# Patient Record
Sex: Male | Born: 1945 | Race: White | Hispanic: No | Smoking: Never smoker
Health system: Southern US, Community
[De-identification: ages and names within clinical notes are randomized; demographics above are authoritative.]

## PROBLEM LIST (undated history)

## (undated) DIAGNOSIS — C61 Malignant neoplasm of prostate: Secondary | ICD-10-CM

## (undated) DIAGNOSIS — E7800 Pure hypercholesterolemia, unspecified: Secondary | ICD-10-CM

## (undated) DIAGNOSIS — I10 Essential (primary) hypertension: Secondary | ICD-10-CM

## (undated) HISTORY — PX: ABLATION OF DYSRHYTHMIC FOCUS: SHX254

## (undated) HISTORY — PX: OTHER SURGICAL HISTORY: SHX169

---

## 2021-01-12 ENCOUNTER — Encounter: Payer: Self-pay | Admitting: Medical Oncology

## 2021-01-26 ENCOUNTER — Telehealth: Payer: Self-pay | Admitting: Pharmacist

## 2021-01-26 ENCOUNTER — Other Ambulatory Visit: Payer: Self-pay

## 2021-01-26 ENCOUNTER — Other Ambulatory Visit (HOSPITAL_COMMUNITY): Payer: Self-pay

## 2021-01-26 ENCOUNTER — Encounter: Payer: Self-pay | Admitting: Medical Oncology

## 2021-01-26 ENCOUNTER — Inpatient Hospital Stay: Payer: Medicare Other | Attending: Oncology | Admitting: Oncology

## 2021-01-26 ENCOUNTER — Other Ambulatory Visit: Payer: Self-pay | Admitting: *Deleted

## 2021-01-26 VITALS — BP 155/69 | HR 52 | Temp 97.5°F | Resp 17 | Ht 69.0 in | Wt 221.3 lb

## 2021-01-26 DIAGNOSIS — Z7982 Long term (current) use of aspirin: Secondary | ICD-10-CM

## 2021-01-26 DIAGNOSIS — Z79899 Other long term (current) drug therapy: Secondary | ICD-10-CM

## 2021-01-26 DIAGNOSIS — I4891 Unspecified atrial fibrillation: Secondary | ICD-10-CM

## 2021-01-26 DIAGNOSIS — C61 Malignant neoplasm of prostate: Secondary | ICD-10-CM | POA: Diagnosis not present

## 2021-01-26 DIAGNOSIS — I10 Essential (primary) hypertension: Secondary | ICD-10-CM | POA: Diagnosis not present

## 2021-01-26 DIAGNOSIS — E291 Testicular hypofunction: Secondary | ICD-10-CM

## 2021-01-26 DIAGNOSIS — E785 Hyperlipidemia, unspecified: Secondary | ICD-10-CM

## 2021-01-26 DIAGNOSIS — C7951 Secondary malignant neoplasm of bone: Secondary | ICD-10-CM

## 2021-01-26 DIAGNOSIS — Z7901 Long term (current) use of anticoagulants: Secondary | ICD-10-CM

## 2021-01-26 MED ORDER — PREDNISONE 5 MG PO TABS
5.0000 mg | ORAL_TABLET | Freq: Every day | ORAL | 3 refills | Status: DC
  Filled 2021-01-26: qty 90, 90d supply, fill #0
  Filled 2021-05-10: qty 90, 90d supply, fill #1
  Filled 2021-05-31 – 2021-08-15 (×2): qty 90, 90d supply, fill #2
  Filled 2021-08-31 – 2021-11-10 (×2): qty 90, 90d supply, fill #3

## 2021-01-26 MED ORDER — ABIRATERONE ACETATE 250 MG PO TABS
1000.0000 mg | ORAL_TABLET | Freq: Every day | ORAL | 0 refills | Status: DC
  Filled 2021-01-26 – 2021-02-10 (×2): qty 120, 30d supply, fill #0

## 2021-01-26 NOTE — Progress Notes (Signed)
Reason for the request:    Prostate cancer  HPI: I was asked by Dr. Severiano Gilbert  to evaluate Raymond Huang for the evaluation of prostate cancer.  He is a 75 year old man with hypertension and hyperlipidemia and atrial fibrillation.  He is originally from [place] and relocated locally to live with his daughter.  He was found to have an elevated PSA and nodular prostate dating back to 2017.  He has been followed with urology regarding this issue and underwent a previous biopsy that did not show malignancy at that time.  He was found to have an elevated PSA up to 4.5 in March of 2022.  MRI of the pelvis obtained on December 15, 2020 was obtained which showed a left-sided mid sacral lesion measuring 2.3 cm and with the right inferior pubic ramus that showed a 1.5 cm bone lesion.  These are suspicious for metastatic disease.  No pelvic adenopathy noted.  He has subsequently underwent prostate biopsy which showed a Gleason score of 5+4 = 5 and 13 cores.  Based on these findings he was started on Eligard.  He received Eligard last injection on Jan 13, 2021 and also was started on abiraterone 1000 mg with prednisone.  Bone scan on December 04, 2020 showed focal activity in the posterior left mid sacral as well as right inferior pubic ramus.  He is referred to me to establish care.  He is scheduled to see Dr. Tammi Klippel next week for potential radiation treatment.  Clinically, he feels well without any major complaints.  He has reported some occasional hot flashes and some fatigue but otherwise tolerated treatment reasonably well.  His performance status and quality of life remained excellent.  He does not report any headaches, blurry vision, syncope or seizures. Does not report any fevers, chills or sweats.  Does not report any cough, wheezing or hemoptysis.  Does not report any chest pain, palpitation, orthopnea or leg edema.  Does not report any nausea, vomiting or abdominal pain.  Does not report any constipation or  diarrhea.  Does not report any skeletal complaints.    Does not report frequency, urgency or hematuria.  Does not report any skin rashes or lesions. Does not report any heat or cold intolerance.  Does not report any lymphadenopathy or petechiae.  Does not report any anxiety or depression.  Remaining review of systems is negative.    Past medical history significant for hypertension, hyperlipidemia and atrial fibrillation.   current medication clued aspirin, atenolol, lisinopril hydrochlorothiazide combination, lovastatin and Xarelto.    Social History   Socioeconomic History  . Marital status: Widowed    Spouse name: Not on file  . Number of children: Not on file  . Years of education: Not on file  . Highest education level: Not on file  Occupational History  . Not on file  Tobacco Use  . Smoking status: Not on file  . Smokeless tobacco: Not on file  Substance and Sexual Activity  . Alcohol use: Not on file  . Drug use: Not on file  . Sexual activity: Not on file  Other Topics Concern  . Not on file  Social History Narrative  . Not on file   Social Determinants of Health   Financial Resource Strain: Not on file  Food Insecurity: Not on file  Transportation Needs: Not on file  Physical Activity: Not on file  Stress: Not on file  Social Connections: Not on file  Intimate Partner Violence: Not on file  :  Pertinent items are noted in HPI.  Exam: Blood pressure (!) 155/69, pulse (!) 52, temperature (!) 97.5 F (36.4 C), temperature source Tympanic, resp. rate 17, height 5\' 9"  (1.753 m), weight 221 lb 4.8 oz (100.4 kg), SpO2 97 %.   ECOG 1 General appearance: alert and cooperative appeared without distress. Head: atraumatic without any abnormalities. Eyes: conjunctivae/corneas clear. PERRL.  Sclera anicteric. Throat: lips, mucosa, and tongue normal; without oral thrush or ulcers. Resp: clear to auscultation bilaterally without rhonchi, wheezes or dullness to  percussion. Cardio: regular rate and rhythm, S1, S2 normal, no murmur, click, rub or gallop GI: soft, non-tender; bowel sounds normal; no masses,  no organomegaly Skin: Skin color, texture, turgor normal. No rashes or lesions Lymph nodes: Cervical, supraclavicular, and axillary nodes normal. Neurologic: Grossly normal without any motor, sensory or deep tendon reflexes. Musculoskeletal: No joint deformity or effusion.    Assessment and Plan:   75 year old man with:  1.  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  His PSA peaked at 11.6 and found to have a Gleason score 4+5 = 9 and 13 cores.  MRI of the pelvis showed metastatic bone lesions in the left side of the mid sacrum as well as the right inferior pubic ramus.  No other areas of metastasis noted.  He was started on abiraterone and Eligard.  His disease status was updated today and treatment options were reviewed.  The natural course of castration-sensitive advanced prostate cancer with potentially oligometastatic disease with low volume involvement were discussed.  Long-term androgen deprivation therapy with abiraterone is certainly the standard of care at this time.  Additional therapy such as systemic chemotherapy utilizing Taxotere is deferred for future dates.  The role for local therapy with radiation and possibly ablative therapy to any oligometastatic lesions could also be considered.  I am in favor of updating his staging scans with PSMA PET to fully evaluate his metastatic disease prior to consideration for local therapy.  In the meantime, we will continue abiraterone 1000 mg daily with prednisone.   2.  Androgen depravation: He is currently on Eligard every 3 months and last injection given on Jan 13, 2021.  This will be repeated in 3 months.  3.  Bone directed therapy: I recommended calcium and vitamin D supplements with considerations for Xgeva after obtaining dental clearance.  4.  Hypertension:  This will be monitored on Zytiga.  Blood pressure today is slightly elevated Continue to monitor.   5.  Goals of care and prognosis: His disease is likely incurable although aggressive measures are warranted given his reasonable performance status.  6.  Follow-up: will be in the next few months to follow his progress.  60  minutes were dedicated to this visit. The time was spent on reviewing laboratory data, imaging studies, discussing treatment options, and answering questions regarding future plan.     A copy of this consult has been forwarded to the requesting physician.

## 2021-01-26 NOTE — Telephone Encounter (Signed)
Oral Oncology Pharmacist Encounter  Received new prescription for Zytiga (abiraterone) for the continued treatment of metastatic castration-sensitive prostate cancer in conjunction with ADT and prednisone, planned duration until disease progression or unacceptable drug toxicity.  Prescription dose and frequency assessed for appropriateness. Appropriate for therapy initiation. Note patient will be switching from previously taking abiraterone 250 mg by mouth once daily with low fat meal to now taking abiraterone 1000 mg by mouth once daily on an empty stomach 1 hour before or 2 hours after a meal  CMP and CBC w/ Diff from 01/06/21 assessed, labs stable, no dose adjustments required.  Current medication list in Epic reviewed, no relevant/significant DDIs with Zytiga identified.  Evaluated chart and no patient barriers to medication adherence noted.   Patient agreement for treatment documented in MD note on 01/26/21.  Prescription has been e-scribed to the [facility] for benefits analysis and approval.  Oral Oncology Clinic will continue to follow for insurance authorization, copayment issues, initial counseling and start date.  [name], PharmD, BCPS Hematology/Oncology Clinical Pharmacist [facility] [phone] 01/26/2021 3:12 PM

## 2021-01-27 ENCOUNTER — Other Ambulatory Visit (HOSPITAL_COMMUNITY): Payer: Self-pay

## 2021-01-27 NOTE — Progress Notes (Signed)
GU Location of Tumor / Histology: prostatic adenocarcinoma  If Prostate Cancer, Gleason Score is (5 + 4) and PSA is (4.5) Prostate volume:   Verner Chol. was found to have an elevated PSA and nodular prostate dating back to 2017.  Biopsies of prostate (if applicable) revealed:   Past/Anticipated interventions by urology, if any: prostate biopsy, Eligard, bone scan (focal activity in the posterior left mid sacral as well as right inferior pubic ramus), referral to Dr. Tammi Klippel to discuss radiation options.  Past/Anticipated interventions by medical oncology, if any: no  Weight changes, if any: none  Bowel/Bladder complaints, if any: none   Nausea/Vomiting, if any: none  Pain issues, if any:  none  SAFETY ISSUES:  Prior radiation? none  Pacemaker/ICD? none  Possible current pregnancy? no  Is the patient on methotrexate? no  Current Complaints / other details:  75 year old male. Widowed. Doing well has some questions about dosage of Zytiga that I will call and check on for him. No other issues at present.

## 2021-01-28 ENCOUNTER — Telehealth: Payer: Self-pay

## 2021-01-28 NOTE — Telephone Encounter (Signed)
Oral Oncology Patient Advocate Encounter  After completing a benefits investigation, prior authorization for Zytiga is not required at this time through [employer].  Patient needs to call customer service to see if there is a contracted pharmacy in the area since he is moving from [place]. [facility] is not contracted to fill.  Patient's current plan will terminate on 02/10/21 as he should find a new Medicare plan after moving to [place].    [place] [facility] [place] Phone [phone] Fax [phone] 01/28/2021 9:17 AM

## 2021-01-29 ENCOUNTER — Encounter: Payer: Self-pay | Admitting: Medical Oncology

## 2021-01-29 ENCOUNTER — Other Ambulatory Visit: Payer: Self-pay

## 2021-01-29 ENCOUNTER — Ambulatory Visit
Admission: RE | Admit: 2021-01-29 | Discharge: 2021-01-29 | Disposition: A | Discharge status: Home / Self Care | Payer: Medicare Other | Source: Ambulatory Visit | Attending: Radiation Oncology | Admitting: Radiation Oncology

## 2021-01-29 ENCOUNTER — Encounter: Payer: Self-pay | Admitting: Radiation Oncology

## 2021-01-29 VITALS — BP 134/73 | HR 53 | Temp 97.8°F | Resp 20 | Ht 69.5 in | Wt 217.8 lb

## 2021-01-29 DIAGNOSIS — C61 Malignant neoplasm of prostate: Secondary | ICD-10-CM

## 2021-01-29 HISTORY — DX: Pure hypercholesterolemia, unspecified: E78.00

## 2021-01-29 HISTORY — DX: Essential (primary) hypertension: I10

## 2021-01-29 HISTORY — DX: Malignant neoplasm of prostate: C61

## 2021-01-29 NOTE — Progress Notes (Signed)
Spoke with patient to inform him Dr. Alen Blew, would like for him to take 1000 mg of Zytiga on an empty stomach. I asked him about his new insurance coverage and he has a plan that is effective June 1. I did speak with Lovena Le in nuclear med and she is waiting to see if PET needs authorization. She will contact him on Monday with an appointment. He asked about financial help with Zytiga cost. I explained that the oral chemo pharmacist/tteam, will assist him with this. He voiced understanding of he above.

## 2021-01-29 NOTE — Progress Notes (Signed)
I met Mr. Anthem 5/17, when he consulted with Dr.Shadad. Today he is meeting with Dr. Tammi Klippel to discuss radiation. He has questions about his Zytiga dose. He is currently taking a  250 mg capsule in the morning with prednisone. Dr. Alen Blew has prescribed 1000 mg on an empty stomach. He states his labs were good and Dr. Alen Blew was pleased, so he would like for me to confirm the dose. Dr. Alen Blew ordered a PSMA PET which has not been scheduled. I will follow up on these issues and call him. He voiced understanding.

## 2021-01-29 NOTE — Progress Notes (Signed)
Introduced myself to patient as the prostate nurse navigator and discussed my role. He has recently relocated from [place], [place] to [place] to be closer to his daughter. He was diagnosed in March with prostate cancer. He started ADT,  abiraterone and prednisone on  12/16/2020. He has a consult with Dr. Tammi Klippel later this week to discuss radiation. I will follow up with him 5/20. No barriers to care at this time. Asked him to call me with questions or concerns.

## 2021-01-29 NOTE — Progress Notes (Signed)
Radiation Oncology         [phone]) [phone] ________________________________  Initial outpatient Consultation  Name: Raymond Huang. MRN: [id number]  Date: 01/29/2021  DOB: 1946/03/27  CC:No primary care provider on file.  Ardeen Jourdain, MD   REFERRING PHYSICIAN: Ardeen Jourdain, MD  DIAGNOSIS: 75 y.o. 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    ICD-10-CM   1. Prostate cancer [facility])  C61     HISTORY OF PRESENT ILLNESS: Raymond Huang. is a 75 y.o. male with a diagnosis of prostate cancer. He has a history of elevated PSA since at least 2017 when he had an initial prostate biopsy that was benign.  His PSA has fluctuated since that time and was recently noted to be elevated at 4.54 by his primary care physician in [place], [place]. Prior PSA in 11/2019 was 2.05 and in 06/2019 was 1.7. Accordingly, he underwent prostate MRI on 09/21/20 showing a PI-RADS 5 lesion in the left peripheral zone in the mid gland towards the apex with probable EPE but no lymphadenopathy or seminal vesicle involvement.the prostate volume was estimated to be 37.3 grams. He was referred for evaluation in urology by Dr. Dema Severin and proceeded to transrectal ultrasound with 12 biopsies of the prostate on 11/24/20. Out of 13 core biopsies, all 13 were positive.  The maximum Gleason score was 5+4, and this was seen in 11/13 cores. Additionally, Gleason 4+5 was seen in the remaining two cores.   For disease staging, a bone scan was performed on 12/04/20 showing focal activity in the posterior left mid sacrum and right inferior pubic ramus, suspicious for metastatic disease and milder activity in a posterior lateral right 9th rib, possibly posttraumatic although metastatic disease is not entirely excluded. This prompted an MRI of the pelvis which was performed on 12/15/20 confirming a lesion within the left side of  the mid sacrum and the right inferior pubic ramus, corresponding to the bone scan findings and consistent with metastatic disease.  There was a 7 mm left pelvic sidewall lymph node but no pathologic lymphadenopathy noted.  A repeat PSA on 12/16/20 showed an increase to 11.62, therefore he was started on Eligard and abiraterone. His most recent PSA from 01/13/21 showed a good response to the ADT with a decrease to 0.13 and he received a 12-monthEligard injection at that time.  [facility] [place] [facility] [place] [place] [facility] [facility]won 01/26/21. The recommendation was to proceed with PSMA scan to confirm limited metastatic disease to help determine the most appropriate course of treatment. This study has not yet been scheduled.  The patient reviewed the biopsy results with his urologist and he has kindly been referred today for discussion of potential radiation treatment options.   PREVIOUS RADIATION THERAPY: No  PAST MEDICAL HISTORY:  Past Medical History:  Diagnosis Date  . High cholesterol   . Hypertension   . Prostate cancer ([facility]       PAST SURGICAL HISTORY: Past Surgical History:  Procedure Laterality Date  . ABLATION OF DYSRHYTHMIC FOCUS    . torn meniscus Right   . torn meniscus Left     FAMILY HISTORY: No family history on file.  SOCIAL HISTORY:  Social History   Socioeconomic History  . Marital status: Widowed    Spouse name: Not on file  . Number of children: Not on file  . Years of education:  Not on file  . Highest education level: Not on file  Occupational History  . Not on file  Tobacco Use  . Smoking status: Not on file  . Smokeless tobacco: Not on file  Substance and Sexual Activity  . Alcohol use: Not on file  . Drug use: Not on file  . Sexual activity: Not on file  Other Topics Concern  . Not on file  Social History Narrative  . Not on file   Social Determinants of Health   Financial Resource Strain: Not on file  Food Insecurity: Not on file   Transportation Needs: Not on file  Physical Activity: Not on file  Stress: Not on file  Social Connections: Not on file  Intimate Partner Violence: Not on file    ALLERGIES: Patient has no known allergies.  MEDICATIONS:  Current Outpatient Medications  Medication Sig Dispense Refill  . abiraterone acetate (ZYTIGA) 250 MG tablet Take 4 tablets (1,000 mg total) by mouth daily. Take on an empty stomach 1 hour before or 2 hours after a meal 120 tablet 0  . ammonium lactate (LAC-HYDRIN) 12 % lotion Apply topically.    Marland Kitchen aspirin 81 MG EC tablet Take by mouth.    Marland Kitchen atenolol (TENORMIN) 50 MG tablet Take 50 mg by mouth 2 (two) times daily.    . Coenzyme Q10 200 MG capsule Take by mouth.    Marland Kitchen ketoconazole (NIZORAL) 2 % shampoo Apply topically.    Marland Kitchen lisinopril-hydrochlorothiazide (ZESTORETIC) 20-12.5 MG tablet Take 1 tablet by mouth daily.    . mometasone (ELOCON) 0.1 % lotion Apply 0.1 application topically 1 day or 1 dose.    . Multiple Vitamin (MULTIVITAMINS PO) Take by mouth.    . pravastatin (PRAVACHOL) 40 MG tablet Take 40 mg by mouth daily.    . predniSONE (DELTASONE) 5 MG tablet Take 1 tablet (5 mg total) by mouth daily with breakfast. 90 tablet 3  . rivaroxaban (XARELTO) 20 MG TABS tablet TAKE 1 TABLET DAILY WITH DINNER     No current facility-administered medications for this encounter.    REVIEW OF SYSTEMS:  On review of systems, the patient reports that he is doing well overall. He denies any chest pain, shortness of breath, cough, fevers, chills, night sweats, unintended weight changes. He denies any bowel disturbances, and denies abdominal pain, nausea or vomiting. He denies any new musculoskeletal or joint aches or pains. His IPSS was 1, indicating mild urinary symptoms with nocturia x1 per night only. A complete review of systems is obtained and is otherwise negative.    PHYSICAL EXAM:  Wt Readings from Last 3 Encounters:  01/29/21 217 lb 12.8 oz (98.8 kg)  01/26/21 221 lb  4.8 oz (100.4 kg)   Temp Readings from Last 3 Encounters:  01/29/21 97.8 F (36.6 C)  01/26/21 (!) 97.5 F (36.4 C) (Tympanic)   BP Readings from Last 3 Encounters:  01/29/21 134/73  01/26/21 (!) 155/69   Pulse Readings from Last 3 Encounters:  01/29/21 (!) 53  01/26/21 (!) 52   Pain Assessment Pain Score: 0-No pain/10  In general this is a well appearing Caucasian male in no acute distress. He's alert and oriented x4 and appropriate throughout the examination. Cardiopulmonary assessment is negative for acute distress, and he exhibits normal effort.     KPS = 100  100 - Normal; no complaints; no evidence of disease. 90   - Able to carry on normal activity; minor signs or symptoms of disease. 80   -  Normal activity with effort; some signs or symptoms of disease. 34   - Cares for self; unable to carry on normal activity or to do active work. 60   - Requires occasional assistance, but is able to care for most of his personal needs. 50   - Requires considerable assistance and frequent medical care. 63   - Disabled; requires special care and assistance. 52   - Severely disabled; hospital admission is indicated although death not imminent. 61   - Very sick; hospital admission necessary; active supportive treatment necessary. 10   - Moribund; fatal processes progressing rapidly. 0     - Dead  Karnofsky DA, Abelmann WH, Craver LS and Burchenal JH [phone]) The use of the nitrogen mustards in the palliative treatment of carcinoma: with particular reference to bronchogenic carcinoma Cancer 1 634-56  LABORATORY DATA:  No results found for: WBC, HGB, HCT, MCV, PLT No results found for: NA, K, CL, CO2 No results found for: ALT, AST, GGT, ALKPHOS, BILITOT   RADIOGRAPHY: No results found.    IMPRESSION/PLAN: 1. 75 y.o. gentleman with Stage IV (cT2b, N0, M1b) adenocarcinoma of the prostate with Gleason score of 5+4, and PSA of 11.62. We discussed the patient's workup and outlined the  nature of prostate cancer in this setting. The patient's T stage, Gleason's score, and PSA put him into the very high risk group and recent MRI pelvis confirmed oligometastatic disease in the bony pelvis.  The recommendation is to proceed with a PSMA scan to confirm the extent of metastatic disease to help better inform our treatment recommendations.  Pending this study confirms limited metastatic disease only, he is eligible for a variety of potential treatment options including definitive therapy with LT-ADT and abiraterone concurrent with 4 weeks of external radiation (STAMPEDE study) to the prostate, pelvic nodes and sites of oligometastases versus a more palliative approach with LT-ADT and abiraterone alone. We discussed the available radiation techniques, and focused on the details and logistics of delivery. We discussed and outlined the risks, benefits, short and long-term effects associated with radiotherapy. We also detailed the role of ADT in the treatment of high risk prostate cancer and outlined the associated side effects that could be expected with this therapy which he was already well informed.  He appears to have a good understanding of his disease and our treatment recommendations which are of curative intent pending his upcoming PSMA scan confirms oligometastatic disease.  He was encouraged to ask questions that were answered to his stated satisfaction.  At the conclusion of our conversation, the patient is interested in moving forward with a PSMA scan and pending this study confirms limited metastatic disease only, he would like to proceed with definitive therapy with LT-ADT and abiraterone concurrent with 4 weeks of external radiation (STAMPEDE study) to the prostate, pelvic nodes and sites of oligometastases.  We will share our discussion with Dr. Alen Blew and once we have the results from his PSMA scan, we will proceed with treatment planning accordingly, in anticipation of beginning his daily  treatments in June 2022, approximately 2 months after starting ADT. We enjoyed meeting him today and look forward to continuing to participate in his care.  He knows that he is welcome to call anytime with any questions or concerns in the interim.  We personally spent 80 minutes in this encounter including chart review, reviewing radiological studies, meeting face-to-face with the patient, entering orders and completing documentation.   Raymond Johns, PA-C    Tyler Pita,  MD  [facility] Direct Dial: [phone]  Fax: [phone] [email or web address]  Skype  [email or web address]   This document serves as a record of services personally performed by Tyler Pita, MD and Freeman Caldron, PA-C. It was created on their behalf by Wilburn Mylar, a trained medical scribe. The creation of this record is based on the scribe's personal observations and the provider's statements to them. This document has been checked and approved by the attending provider.

## 2021-02-01 ENCOUNTER — Encounter: Payer: Self-pay | Admitting: Oncology

## 2021-02-01 ENCOUNTER — Other Ambulatory Visit (HOSPITAL_COMMUNITY): Payer: Self-pay

## 2021-02-01 NOTE — Telephone Encounter (Signed)
Oral Chemotherapy Pharmacist Encounter   Spoke with patient today to follow up regarding patient's oral chemotherapy medication: Zytiga (abiraterone acetate)  Discussed in detail with patient that he is now taking Zytiga 250 mg tablets, 4 tablets (1000 mg total) by mouth once daily on an empty stomach either 1 hour before or 2 hours after a meal. Patient verbalized understanding and started taking 4 tablets once daily on 02/01/21. Patient confirmed he continues to take prednisone 5 mg tablet by mouth once daily with breakfast. Patient states that he has enough medication on hand from his previous pharmacy to get him through June 1st.  Reviewed with patient administration, dosing, side effects, monitoring, drug-food interactions, safe handling, storage, and disposal. Patient expressed no concerns about side effects with Zytiga and states he has been tolerating the medication very well.   Patient's new insurance information has been put on file at the outpatient pharmacy, which will be effective June 1st. Raymond Huang has obtained the patient's prostate cancer grant information that was being utilized at Raymond Huang's previous specialty pharmacy in [place].   Patient knows to call the office with questions or concerns. Oral Chemotherapy Clinic phone number provided to patient.   [name], PharmD, BCPS Hematology/Oncology Clinical Pharmacist [facility] [phone] 02/01/2021 2:11 PM

## 2021-02-04 ENCOUNTER — Encounter: Payer: Self-pay | Admitting: Medical Oncology

## 2021-02-09 ENCOUNTER — Encounter: Payer: Self-pay | Admitting: Medical Oncology

## 2021-02-10 ENCOUNTER — Encounter: Payer: Self-pay | Admitting: Medical Oncology

## 2021-02-10 ENCOUNTER — Other Ambulatory Visit (HOSPITAL_COMMUNITY): Payer: Self-pay

## 2021-02-10 ENCOUNTER — Encounter: Payer: Self-pay | Admitting: Oncology

## 2021-02-10 ENCOUNTER — Other Ambulatory Visit: Payer: Self-pay

## 2021-02-10 ENCOUNTER — Ambulatory Visit: Admission: RE | Admit: 2021-02-10 | Payer: Medicare Other | Source: Ambulatory Visit | Admitting: Radiation Oncology

## 2021-02-10 ENCOUNTER — Other Ambulatory Visit: Payer: Self-pay | Admitting: Oncology

## 2021-02-10 MED ORDER — ABIRATERONE ACETATE 250 MG PO TABS
1000.0000 mg | ORAL_TABLET | Freq: Every day | ORAL | 0 refills | Status: DC
  Filled 2021-02-11: qty 120, 30d supply, fill #0

## 2021-02-11 ENCOUNTER — Other Ambulatory Visit (HOSPITAL_COMMUNITY): Payer: Self-pay

## 2021-02-12 ENCOUNTER — Encounter: Payer: Self-pay | Admitting: Medical Oncology

## 2021-02-12 ENCOUNTER — Encounter: Payer: Self-pay | Admitting: Oncology

## 2021-02-12 ENCOUNTER — Other Ambulatory Visit (HOSPITAL_COMMUNITY): Payer: Self-pay

## 2021-02-12 NOTE — Progress Notes (Signed)
Spoke with patient to see if he has been contacted with an appointment for PSMA scan. He voiced his concern that he was scheduled for CT simulation and after he arrived and was call back, he was told he did not have an appointment. He was scheduled for radiation treatments and now all the appointments are gone. I explained Dr. Tammi Klippel would like to get the PET results before treatment, to confirm if there are or are not any new cancer mets. I explained the ADT and the Zytiga are treating his cancer. I will follow up with nuclear med to see why PET has not been scheduled. He voiced understanding.

## 2021-02-12 NOTE — Progress Notes (Signed)
Spoke with patient to confirm, he received a call from [place] in nuclear med to schedule PSMA PET. He states he did and thankful for appointment. We discussed once the PET is resulted, Dr. Tammi Klippel will know how to proceed with treatment. He voiced his appreciation.  I asked him to call me with questions or concerns.

## 2021-02-12 NOTE — Progress Notes (Signed)
Left message with Lovena Le in nuclear med to follow up on PSMA scan scheduling.

## 2021-02-12 NOTE — Progress Notes (Signed)
Raymond Huang from nuclear med left a message asking about patinet's new insurance information. I left her a message that new insurance information is under media.

## 2021-02-16 ENCOUNTER — Encounter: Payer: Self-pay | Admitting: Oncology

## 2021-02-22 ENCOUNTER — Encounter: Payer: Self-pay | Admitting: Oncology

## 2021-02-22 ENCOUNTER — Ambulatory Visit: Payer: Medicare Other

## 2021-02-23 ENCOUNTER — Other Ambulatory Visit: Payer: Self-pay

## 2021-02-23 ENCOUNTER — Inpatient Hospital Stay: Payer: Medicare Other | Attending: Oncology

## 2021-02-23 ENCOUNTER — Encounter: Payer: Self-pay | Admitting: Oncology

## 2021-02-23 ENCOUNTER — Ambulatory Visit: Payer: Medicare Other

## 2021-02-23 DIAGNOSIS — C61 Malignant neoplasm of prostate: Secondary | ICD-10-CM | POA: Diagnosis present

## 2021-02-23 LAB — CBC WITH DIFFERENTIAL (CANCER CENTER ONLY)
Abs Immature Granulocytes: 0.01 10*3/uL (ref 0.00–0.07)
Basophils Absolute: 0.1 10*3/uL (ref 0.0–0.1)
Basophils Relative: 1 %
Eosinophils Absolute: 0.2 10*3/uL (ref 0.0–0.5)
Eosinophils Relative: 2 %
HCT: 39 % (ref 39.0–52.0)
Hemoglobin: 13.8 g/dL (ref 13.0–17.0)
Immature Granulocytes: 0 %
Lymphocytes Relative: 14 %
Lymphs Abs: 1.1 10*3/uL (ref 0.7–4.0)
MCH: 31.1 pg (ref 26.0–34.0)
MCHC: 35.4 g/dL (ref 30.0–36.0)
MCV: 87.8 fL (ref 80.0–100.0)
Monocytes Absolute: 0.4 10*3/uL (ref 0.1–1.0)
Monocytes Relative: 5 %
Neutro Abs: 6 10*3/uL (ref 1.7–7.7)
Neutrophils Relative %: 78 %
Platelet Count: 239 10*3/uL (ref 150–400)
RBC: 4.44 MIL/uL (ref 4.22–5.81)
RDW: 12.2 % (ref 11.5–15.5)
WBC Count: 7.7 10*3/uL (ref 4.0–10.5)
nRBC: 0 % (ref 0.0–0.2)

## 2021-02-23 LAB — CMP (CANCER CENTER ONLY)
ALT: 20 U/L (ref 0–44)
AST: 21 U/L (ref 15–41)
Albumin: 4.1 g/dL (ref 3.5–5.0)
Alkaline Phosphatase: 48 U/L (ref 38–126)
Anion gap: 11 (ref 5–15)
BUN: 20 mg/dL (ref 8–23)
CO2: 24 mmol/L (ref 22–32)
Calcium: 10 mg/dL (ref 8.9–10.3)
Chloride: 103 mmol/L (ref 98–111)
Creatinine: 1.18 mg/dL (ref 0.61–1.24)
GFR, Estimated: >60 mL/min (ref >60)
Glucose, Bld: 135 mg/dL — ABNORMAL HIGH (ref 70–99)
Potassium: 4.1 mmol/L (ref 3.5–5.1)
Sodium: 138 mmol/L (ref 135–145)
Total Bilirubin: 1.1 mg/dL (ref 0.3–1.2)
Total Protein: 6.9 g/dL (ref 6.5–8.1)

## 2021-02-23 NOTE — Progress Notes (Signed)
Met with patient at registration to introduce myself as Financial Resource Specialist and to offer available resources.  Discussed one-time $1000 [employer] grant and qualifications to assist with personal expenses while going through treatment.  Gave him my card if interested in applying and for any additional financial questions or concerns.  

## 2021-02-24 ENCOUNTER — Ambulatory Visit: Payer: Medicare Other

## 2021-02-24 ENCOUNTER — Ambulatory Visit (HOSPITAL_COMMUNITY)
Admission: RE | Admit: 2021-02-24 | Discharge: 2021-02-24 | Disposition: A | Discharge status: Home / Self Care | Payer: Medicare Other | Source: Ambulatory Visit | Attending: Oncology | Admitting: Oncology

## 2021-02-24 ENCOUNTER — Telehealth: Payer: Self-pay | Admitting: *Deleted

## 2021-02-24 DIAGNOSIS — C61 Malignant neoplasm of prostate: Secondary | ICD-10-CM | POA: Diagnosis present

## 2021-02-24 LAB — PROSTATE-SPECIFIC AG, SERUM (LABCORP): Prostate Specific Ag, Serum: <0.1 ng/mL (ref 0.0–4.0)

## 2021-02-24 MED ORDER — PIFLIFOLASTAT F 18 (PYLARIFY) INJECTION
9.0000 | Freq: Once | INTRAVENOUS | Status: AC
  Administered 2021-02-24: 9.9 via INTRAVENOUS

## 2021-02-24 NOTE — Telephone Encounter (Signed)
Contacted patient regarding test results per Dr.Shadad - patient verbalized understanding of information

## 2021-02-24 NOTE — Telephone Encounter (Signed)
-----   Message from Wyatt Portela, MD sent at 02/24/2021  8:27 AM EDT ----- Please let him know his PSA is still low

## 2021-02-25 ENCOUNTER — Ambulatory Visit: Payer: Medicare Other

## 2021-02-26 ENCOUNTER — Ambulatory Visit
Admission: RE | Admit: 2021-02-26 | Discharge: 2021-02-26 | Disposition: A | Discharge status: Home / Self Care | Payer: Medicare Other | Source: Ambulatory Visit | Attending: Radiation Oncology | Admitting: Radiation Oncology

## 2021-02-26 ENCOUNTER — Ambulatory Visit: Payer: Medicare Other

## 2021-02-26 ENCOUNTER — Other Ambulatory Visit: Payer: Self-pay

## 2021-02-26 DIAGNOSIS — C61 Malignant neoplasm of prostate: Secondary | ICD-10-CM | POA: Diagnosis present

## 2021-02-26 DIAGNOSIS — C7931 Secondary malignant neoplasm of brain: Secondary | ICD-10-CM | POA: Diagnosis not present

## 2021-02-26 DIAGNOSIS — Z510 Encounter for antineoplastic radiation therapy: Secondary | ICD-10-CM | POA: Insufficient documentation

## 2021-02-26 NOTE — Progress Notes (Signed)
  Radiation Oncology         [phone]) [phone] ________________________________  Name: Raymond Huang. MRN: [id number]  Date: 02/26/2021  DOB: 08-23-1946  SIMULATION AND TREATMENT PLANNING NOTE    ICD-10-CM   1. Prostate cancer ([place])  C61       DIAGNOSIS:  75 y.o. 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  NARRATIVE:  The patient was brought to the [facility].  Identity was confirmed.  All relevant records and images related to the planned course of therapy were reviewed.  The patient freely provided informed written consent to proceed with treatment after reviewing the details related to the planned course of therapy. The consent form was witnessed and verified by the simulation staff.  Then, the patient was set-up in a stable reproducible supine position for radiation therapy.  A vacuum lock pillow device was custom fabricated to position his legs in a reproducible immobilized position.  Then, I performed a urethrogram under sterile conditions to identify the prostatic apex.  CT images were obtained.  Surface markings were placed.  The CT images were loaded into the planning software.  Then the prostate target and avoidance structures including the rectum, bladder, bowel and hips were contoured.  Treatment planning then occurred.  The radiation prescription was entered and confirmed.  A total of one complex treatment devices were fabricated. I have requested : Intensity Modulated Radiotherapy (IMRT) is medically necessary for this case for the following reason:  Rectal sparing.Marland Kitchen  PLAN:  The patient will receive 44 Gy in 20 fractions of 2.2 Gy, using a simultaneous integrated boost (SIB) to the prostate and pelvic bone oligometastasis to a total dose of 60 Gy with 20 fractions of 3.0 Gy.  ________________________________  Sheral Apley Tammi Klippel, M.D.

## 2021-03-01 ENCOUNTER — Ambulatory Visit: Payer: Medicare Other

## 2021-03-01 DIAGNOSIS — C61 Malignant neoplasm of prostate: Secondary | ICD-10-CM | POA: Diagnosis not present

## 2021-03-02 ENCOUNTER — Ambulatory Visit: Payer: Medicare Other

## 2021-03-03 ENCOUNTER — Ambulatory Visit: Payer: Medicare Other

## 2021-03-04 ENCOUNTER — Other Ambulatory Visit: Payer: Self-pay | Admitting: Oncology

## 2021-03-04 ENCOUNTER — Other Ambulatory Visit (HOSPITAL_COMMUNITY): Payer: Self-pay

## 2021-03-04 ENCOUNTER — Ambulatory Visit: Payer: Medicare Other

## 2021-03-04 MED ORDER — ABIRATERONE ACETATE 250 MG PO TABS
1000.0000 mg | ORAL_TABLET | Freq: Every day | ORAL | 0 refills | Status: DC
  Filled 2021-03-04: qty 120, 30d supply, fill #0

## 2021-03-05 ENCOUNTER — Ambulatory Visit: Payer: Medicare Other

## 2021-03-08 ENCOUNTER — Ambulatory Visit: Payer: Medicare Other

## 2021-03-09 ENCOUNTER — Ambulatory Visit: Payer: Medicare Other

## 2021-03-09 ENCOUNTER — Other Ambulatory Visit (HOSPITAL_COMMUNITY): Payer: Self-pay

## 2021-03-10 ENCOUNTER — Ambulatory Visit: Payer: Medicare Other

## 2021-03-10 ENCOUNTER — Other Ambulatory Visit (HOSPITAL_COMMUNITY): Payer: Self-pay

## 2021-03-10 ENCOUNTER — Other Ambulatory Visit: Payer: Self-pay

## 2021-03-10 ENCOUNTER — Ambulatory Visit
Admission: RE | Admit: 2021-03-10 | Discharge: 2021-03-10 | Disposition: A | Discharge status: Home / Self Care | Payer: Medicare Other | Source: Ambulatory Visit | Attending: Radiation Oncology | Admitting: Radiation Oncology

## 2021-03-10 DIAGNOSIS — Z510 Encounter for antineoplastic radiation therapy: Secondary | ICD-10-CM | POA: Diagnosis not present

## 2021-03-11 ENCOUNTER — Ambulatory Visit: Payer: Medicare Other

## 2021-03-11 ENCOUNTER — Ambulatory Visit
Admission: RE | Admit: 2021-03-11 | Discharge: 2021-03-11 | Disposition: A | Discharge status: Home / Self Care | Payer: Medicare Other | Source: Ambulatory Visit | Attending: Radiation Oncology | Admitting: Radiation Oncology

## 2021-03-11 DIAGNOSIS — Z510 Encounter for antineoplastic radiation therapy: Secondary | ICD-10-CM | POA: Diagnosis not present

## 2021-03-12 ENCOUNTER — Ambulatory Visit
Admission: RE | Admit: 2021-03-12 | Discharge: 2021-03-12 | Disposition: A | Discharge status: Home / Self Care | Payer: Medicare Other | Source: Ambulatory Visit | Attending: Radiation Oncology | Admitting: Radiation Oncology

## 2021-03-12 ENCOUNTER — Ambulatory Visit: Payer: Medicare Other

## 2021-03-12 ENCOUNTER — Other Ambulatory Visit: Payer: Self-pay

## 2021-03-12 DIAGNOSIS — C7931 Secondary malignant neoplasm of brain: Secondary | ICD-10-CM | POA: Diagnosis not present

## 2021-03-12 DIAGNOSIS — C61 Malignant neoplasm of prostate: Secondary | ICD-10-CM | POA: Diagnosis present

## 2021-03-12 DIAGNOSIS — Z510 Encounter for antineoplastic radiation therapy: Secondary | ICD-10-CM | POA: Insufficient documentation

## 2021-03-12 NOTE — Progress Notes (Signed)
Pt here for patient teaching.    Pt given Radiation and You booklet and skin care instructions.    Reviewed areas of pertinence such as diarrhea, fatigue, hair loss, nausea and vomiting, sexual and fertility changes, skin changes, and urinary and bladder changes .   Pt able to give teach back of to pat skin, use unscented/gentle soap, use baby wipes, have Imodium on hand, and drink plenty of water,avoid applying anything to skin within 4 hours of treatment.   Pt verbalizes understanding of information given and will contact nursing with any questions or concerns.    [email or web address]

## 2021-03-16 ENCOUNTER — Other Ambulatory Visit: Payer: Self-pay

## 2021-03-16 ENCOUNTER — Ambulatory Visit: Payer: Medicare Other

## 2021-03-16 ENCOUNTER — Ambulatory Visit
Admission: RE | Admit: 2021-03-16 | Discharge: 2021-03-16 | Disposition: A | Discharge status: Home / Self Care | Payer: Medicare Other | Source: Ambulatory Visit | Attending: Radiation Oncology | Admitting: Radiation Oncology

## 2021-03-16 DIAGNOSIS — Z510 Encounter for antineoplastic radiation therapy: Secondary | ICD-10-CM | POA: Diagnosis not present

## 2021-03-17 ENCOUNTER — Ambulatory Visit: Payer: Medicare Other

## 2021-03-17 ENCOUNTER — Ambulatory Visit
Admission: RE | Admit: 2021-03-17 | Discharge: 2021-03-17 | Disposition: A | Discharge status: Home / Self Care | Payer: Medicare Other | Source: Ambulatory Visit | Attending: Radiation Oncology | Admitting: Radiation Oncology

## 2021-03-17 DIAGNOSIS — Z510 Encounter for antineoplastic radiation therapy: Secondary | ICD-10-CM | POA: Diagnosis not present

## 2021-03-18 ENCOUNTER — Ambulatory Visit
Admission: RE | Admit: 2021-03-18 | Discharge: 2021-03-18 | Disposition: A | Discharge status: Home / Self Care | Payer: Medicare Other | Source: Ambulatory Visit | Attending: Radiation Oncology | Admitting: Radiation Oncology

## 2021-03-18 ENCOUNTER — Ambulatory Visit: Payer: Medicare Other

## 2021-03-18 ENCOUNTER — Other Ambulatory Visit: Payer: Self-pay

## 2021-03-18 DIAGNOSIS — Z510 Encounter for antineoplastic radiation therapy: Secondary | ICD-10-CM | POA: Diagnosis not present

## 2021-03-19 ENCOUNTER — Ambulatory Visit: Payer: Medicare Other

## 2021-03-19 ENCOUNTER — Ambulatory Visit
Admission: RE | Admit: 2021-03-19 | Discharge: 2021-03-19 | Disposition: A | Discharge status: Home / Self Care | Payer: Medicare Other | Source: Ambulatory Visit | Attending: Radiation Oncology | Admitting: Radiation Oncology

## 2021-03-19 DIAGNOSIS — Z510 Encounter for antineoplastic radiation therapy: Secondary | ICD-10-CM | POA: Diagnosis not present

## 2021-03-22 ENCOUNTER — Other Ambulatory Visit: Payer: Self-pay

## 2021-03-22 ENCOUNTER — Ambulatory Visit: Payer: Medicare Other

## 2021-03-22 ENCOUNTER — Ambulatory Visit
Admission: RE | Admit: 2021-03-22 | Discharge: 2021-03-22 | Disposition: A | Discharge status: Home / Self Care | Payer: Medicare Other | Source: Ambulatory Visit | Attending: Radiation Oncology | Admitting: Radiation Oncology

## 2021-03-22 DIAGNOSIS — Z510 Encounter for antineoplastic radiation therapy: Secondary | ICD-10-CM | POA: Diagnosis not present

## 2021-03-23 ENCOUNTER — Ambulatory Visit
Admission: RE | Admit: 2021-03-23 | Discharge: 2021-03-23 | Disposition: A | Discharge status: Home / Self Care | Payer: Medicare Other | Source: Ambulatory Visit | Attending: Radiation Oncology | Admitting: Radiation Oncology

## 2021-03-23 ENCOUNTER — Ambulatory Visit: Payer: Medicare Other

## 2021-03-23 DIAGNOSIS — Z510 Encounter for antineoplastic radiation therapy: Secondary | ICD-10-CM | POA: Diagnosis not present

## 2021-03-24 ENCOUNTER — Other Ambulatory Visit: Payer: Self-pay

## 2021-03-24 ENCOUNTER — Ambulatory Visit: Payer: Medicare Other

## 2021-03-24 ENCOUNTER — Ambulatory Visit
Admission: RE | Admit: 2021-03-24 | Discharge: 2021-03-24 | Disposition: A | Discharge status: Home / Self Care | Payer: Medicare Other | Source: Ambulatory Visit | Attending: Radiation Oncology | Admitting: Radiation Oncology

## 2021-03-24 DIAGNOSIS — Z510 Encounter for antineoplastic radiation therapy: Secondary | ICD-10-CM | POA: Diagnosis not present

## 2021-03-25 ENCOUNTER — Ambulatory Visit
Admission: RE | Admit: 2021-03-25 | Discharge: 2021-03-25 | Disposition: A | Discharge status: Home / Self Care | Payer: Medicare Other | Source: Ambulatory Visit | Attending: Radiation Oncology | Admitting: Radiation Oncology

## 2021-03-25 ENCOUNTER — Ambulatory Visit: Payer: Medicare Other

## 2021-03-25 DIAGNOSIS — Z510 Encounter for antineoplastic radiation therapy: Secondary | ICD-10-CM | POA: Diagnosis not present

## 2021-03-26 ENCOUNTER — Ambulatory Visit: Payer: Medicare Other

## 2021-03-26 ENCOUNTER — Encounter: Payer: Self-pay | Admitting: Oncology

## 2021-03-29 ENCOUNTER — Other Ambulatory Visit: Payer: Self-pay

## 2021-03-29 ENCOUNTER — Ambulatory Visit: Payer: Medicare Other

## 2021-03-29 ENCOUNTER — Ambulatory Visit
Admission: RE | Admit: 2021-03-29 | Discharge: 2021-03-29 | Disposition: A | Discharge status: Home / Self Care | Payer: Medicare Other | Source: Ambulatory Visit | Attending: Radiation Oncology | Admitting: Radiation Oncology

## 2021-03-29 DIAGNOSIS — Z510 Encounter for antineoplastic radiation therapy: Secondary | ICD-10-CM | POA: Diagnosis not present

## 2021-03-30 ENCOUNTER — Ambulatory Visit: Payer: Medicare Other

## 2021-03-30 ENCOUNTER — Ambulatory Visit
Admission: RE | Admit: 2021-03-30 | Discharge: 2021-03-30 | Disposition: A | Discharge status: Home / Self Care | Payer: Medicare Other | Source: Ambulatory Visit | Attending: Radiation Oncology | Admitting: Radiation Oncology

## 2021-03-30 DIAGNOSIS — Z510 Encounter for antineoplastic radiation therapy: Secondary | ICD-10-CM | POA: Diagnosis not present

## 2021-03-31 ENCOUNTER — Ambulatory Visit
Admission: RE | Admit: 2021-03-31 | Discharge: 2021-03-31 | Disposition: A | Discharge status: Home / Self Care | Payer: Medicare Other | Source: Ambulatory Visit | Attending: Radiation Oncology | Admitting: Radiation Oncology

## 2021-03-31 ENCOUNTER — Other Ambulatory Visit: Payer: Self-pay

## 2021-03-31 ENCOUNTER — Ambulatory Visit: Payer: Medicare Other

## 2021-03-31 DIAGNOSIS — Z510 Encounter for antineoplastic radiation therapy: Secondary | ICD-10-CM | POA: Diagnosis not present

## 2021-04-01 ENCOUNTER — Ambulatory Visit
Admission: RE | Admit: 2021-04-01 | Discharge: 2021-04-01 | Disposition: A | Discharge status: Home / Self Care | Payer: Medicare Other | Source: Ambulatory Visit | Attending: Radiation Oncology | Admitting: Radiation Oncology

## 2021-04-01 ENCOUNTER — Ambulatory Visit: Payer: Medicare Other

## 2021-04-01 DIAGNOSIS — Z510 Encounter for antineoplastic radiation therapy: Secondary | ICD-10-CM | POA: Diagnosis not present

## 2021-04-02 ENCOUNTER — Ambulatory Visit
Admission: RE | Admit: 2021-04-02 | Discharge: 2021-04-02 | Disposition: A | Discharge status: Home / Self Care | Payer: Medicare Other | Source: Ambulatory Visit | Attending: Radiation Oncology | Admitting: Radiation Oncology

## 2021-04-02 ENCOUNTER — Other Ambulatory Visit: Payer: Self-pay

## 2021-04-02 ENCOUNTER — Other Ambulatory Visit: Payer: Self-pay | Admitting: Oncology

## 2021-04-02 ENCOUNTER — Other Ambulatory Visit (HOSPITAL_COMMUNITY): Payer: Self-pay

## 2021-04-02 ENCOUNTER — Ambulatory Visit: Payer: Medicare Other

## 2021-04-02 DIAGNOSIS — Z510 Encounter for antineoplastic radiation therapy: Secondary | ICD-10-CM | POA: Diagnosis not present

## 2021-04-02 MED ORDER — ABIRATERONE ACETATE 250 MG PO TABS
1000.0000 mg | ORAL_TABLET | Freq: Every day | ORAL | 0 refills | Status: DC
  Filled 2021-04-02: qty 120, 30d supply, fill #0

## 2021-04-05 ENCOUNTER — Other Ambulatory Visit (HOSPITAL_COMMUNITY): Payer: Self-pay

## 2021-04-05 ENCOUNTER — Other Ambulatory Visit: Payer: Self-pay

## 2021-04-05 ENCOUNTER — Ambulatory Visit
Admission: RE | Admit: 2021-04-05 | Discharge: 2021-04-05 | Disposition: A | Discharge status: Home / Self Care | Payer: Medicare Other | Source: Ambulatory Visit | Attending: Radiation Oncology | Admitting: Radiation Oncology

## 2021-04-05 ENCOUNTER — Ambulatory Visit: Payer: Medicare Other

## 2021-04-05 DIAGNOSIS — Z510 Encounter for antineoplastic radiation therapy: Secondary | ICD-10-CM | POA: Diagnosis not present

## 2021-04-06 ENCOUNTER — Ambulatory Visit: Payer: Medicare Other

## 2021-04-06 ENCOUNTER — Ambulatory Visit
Admission: RE | Admit: 2021-04-06 | Discharge: 2021-04-06 | Disposition: A | Discharge status: Home / Self Care | Payer: Medicare Other | Source: Ambulatory Visit | Attending: Radiation Oncology | Admitting: Radiation Oncology

## 2021-04-06 ENCOUNTER — Other Ambulatory Visit (HOSPITAL_COMMUNITY): Payer: Self-pay

## 2021-04-06 DIAGNOSIS — Z510 Encounter for antineoplastic radiation therapy: Secondary | ICD-10-CM | POA: Diagnosis not present

## 2021-04-07 ENCOUNTER — Ambulatory Visit
Admission: RE | Admit: 2021-04-07 | Discharge: 2021-04-07 | Disposition: A | Discharge status: Home / Self Care | Payer: Medicare Other | Source: Ambulatory Visit | Attending: Radiation Oncology | Admitting: Radiation Oncology

## 2021-04-07 ENCOUNTER — Ambulatory Visit: Payer: Medicare Other

## 2021-04-07 ENCOUNTER — Other Ambulatory Visit: Payer: Self-pay

## 2021-04-07 DIAGNOSIS — Z510 Encounter for antineoplastic radiation therapy: Secondary | ICD-10-CM | POA: Diagnosis not present

## 2021-04-08 ENCOUNTER — Encounter: Payer: Self-pay | Admitting: Urology

## 2021-04-08 ENCOUNTER — Ambulatory Visit
Admission: RE | Admit: 2021-04-08 | Discharge: 2021-04-08 | Disposition: A | Discharge status: Home / Self Care | Payer: Medicare Other | Source: Ambulatory Visit | Attending: Radiation Oncology | Admitting: Radiation Oncology

## 2021-04-08 ENCOUNTER — Ambulatory Visit: Payer: Medicare Other

## 2021-04-08 DIAGNOSIS — C61 Malignant neoplasm of prostate: Secondary | ICD-10-CM

## 2021-04-08 DIAGNOSIS — Z510 Encounter for antineoplastic radiation therapy: Secondary | ICD-10-CM | POA: Diagnosis not present

## 2021-04-16 ENCOUNTER — Other Ambulatory Visit: Payer: Medicare Other

## 2021-04-16 ENCOUNTER — Inpatient Hospital Stay: Payer: Medicare Other | Attending: Oncology

## 2021-04-16 ENCOUNTER — Other Ambulatory Visit: Payer: Self-pay

## 2021-04-16 ENCOUNTER — Ambulatory Visit: Payer: Medicare Other | Admitting: Oncology

## 2021-04-16 ENCOUNTER — Inpatient Hospital Stay: Payer: Medicare Other

## 2021-04-16 VITALS — BP 122/61 | HR 52 | Temp 98.6°F | Resp 16

## 2021-04-16 DIAGNOSIS — I10 Essential (primary) hypertension: Secondary | ICD-10-CM | POA: Insufficient documentation

## 2021-04-16 DIAGNOSIS — C61 Malignant neoplasm of prostate: Secondary | ICD-10-CM | POA: Insufficient documentation

## 2021-04-16 DIAGNOSIS — E291 Testicular hypofunction: Secondary | ICD-10-CM | POA: Insufficient documentation

## 2021-04-16 DIAGNOSIS — Z7952 Long term (current) use of systemic steroids: Secondary | ICD-10-CM | POA: Diagnosis not present

## 2021-04-16 DIAGNOSIS — Z923 Personal history of irradiation: Secondary | ICD-10-CM | POA: Insufficient documentation

## 2021-04-16 DIAGNOSIS — Z79899 Other long term (current) drug therapy: Secondary | ICD-10-CM | POA: Diagnosis not present

## 2021-04-16 DIAGNOSIS — C7951 Secondary malignant neoplasm of bone: Secondary | ICD-10-CM | POA: Insufficient documentation

## 2021-04-16 DIAGNOSIS — Z5111 Encounter for antineoplastic chemotherapy: Secondary | ICD-10-CM | POA: Diagnosis present

## 2021-04-16 LAB — CBC WITH DIFFERENTIAL (CANCER CENTER ONLY)
Abs Immature Granulocytes: 0.02 10*3/uL (ref 0.00–0.07)
Basophils Absolute: 0 10*3/uL (ref 0.0–0.1)
Basophils Relative: 1 %
Eosinophils Absolute: 0.1 10*3/uL (ref 0.0–0.5)
Eosinophils Relative: 2 %
HCT: 31.4 % — ABNORMAL LOW (ref 39.0–52.0)
Hemoglobin: 11.3 g/dL — ABNORMAL LOW (ref 13.0–17.0)
Immature Granulocytes: 0 %
Lymphocytes Relative: 8 %
Lymphs Abs: 0.5 10*3/uL — ABNORMAL LOW (ref 0.7–4.0)
MCH: 34.3 pg — ABNORMAL HIGH (ref 26.0–34.0)
MCHC: 36 g/dL (ref 30.0–36.0)
MCV: 95.4 fL (ref 80.0–100.0)
Monocytes Absolute: 0.5 10*3/uL (ref 0.1–1.0)
Monocytes Relative: 8 %
Neutro Abs: 4.9 10*3/uL (ref 1.7–7.7)
Neutrophils Relative %: 81 %
Platelet Count: 225 10*3/uL (ref 150–400)
RBC: 3.29 MIL/uL — ABNORMAL LOW (ref 4.22–5.81)
RDW: 14.9 % (ref 11.5–15.5)
WBC Count: 6 10*3/uL (ref 4.0–10.5)
nRBC: 0 % (ref 0.0–0.2)

## 2021-04-16 LAB — CMP (CANCER CENTER ONLY)
ALT: 32 U/L (ref 0–44)
AST: 29 U/L (ref 15–41)
Albumin: 3.9 g/dL (ref 3.5–5.0)
Alkaline Phosphatase: 47 U/L (ref 38–126)
Anion gap: 9 (ref 5–15)
BUN: 29 mg/dL — ABNORMAL HIGH (ref 8–23)
CO2: 24 mmol/L (ref 22–32)
Calcium: 9.6 mg/dL (ref 8.9–10.3)
Chloride: 103 mmol/L (ref 98–111)
Creatinine: 1.43 mg/dL — ABNORMAL HIGH (ref 0.61–1.24)
GFR, Estimated: 51 mL/min — ABNORMAL LOW (ref >60)
Glucose, Bld: 134 mg/dL — ABNORMAL HIGH (ref 70–99)
Potassium: 4.4 mmol/L (ref 3.5–5.1)
Sodium: 136 mmol/L (ref 135–145)
Total Bilirubin: 1.2 mg/dL (ref 0.3–1.2)
Total Protein: 6.4 g/dL — ABNORMAL LOW (ref 6.5–8.1)

## 2021-04-16 MED ORDER — LEUPROLIDE ACETATE (3 MONTH) 22.5 MG ~~LOC~~ KIT
22.5000 mg | PACK | Freq: Once | SUBCUTANEOUS | Status: AC
  Administered 2021-04-16: 22.5 mg via SUBCUTANEOUS

## 2021-04-16 MED ORDER — LEUPROLIDE ACETATE (3 MONTH) 22.5 MG ~~LOC~~ KIT
PACK | SUBCUTANEOUS | Status: AC
  Filled 2021-04-16: qty 22.5

## 2021-04-16 NOTE — Patient Instructions (Signed)
Leuprolide depot injection What is this medication? LEUPROLIDE (loo PROE lide) is a man-made protein that acts like a natural hormone in the body. It decreases testosterone in men and decreases estrogen in women. In men, this medicine is used to treat advanced prostate cancer. In women, some forms of this medicine may be used to treat endometriosis, uterinefibroids, or other male hormone-related problems. This medicine may be used for other purposes; ask your health care provider orpharmacist if you have questions. COMMON BRAND NAME(S): Eligard, Fensolv, Lupron Depot, Lupron Depot-Ped, Viadur What should I tell my care team before I take this medication? They need to know if you have any of these conditions: diabetes heart disease or previous heart attack high blood pressure high cholesterol mental illness osteoporosis pain or difficulty passing urine seizures spinal cord metastasis stroke suicidal thoughts, plans, or attempt; a previous suicide attempt by you or a family member tobacco smoker unusual vaginal bleeding (women) an unusual or allergic reaction to leuprolide, benzyl alcohol, other medicines, foods, dyes, or preservatives pregnant or trying to get pregnant breast-feeding How should I use this medication? This medicine is for injection into a muscle or for injection under the skin. It is given by a health care professional in a hospital or clinic setting. The specific product will determine how it will be given to you. Make sure youunderstand which product you receive and how often you will receive it. Talk to your pediatrician regarding the use of this medicine in children.Special care may be needed. Overdosage: If you think you have taken too much of this medicine contact apoison control center or emergency room at once. NOTE: This medicine is only for you. Do not share this medicine with others. What if I miss a dose? It is important not to miss a dose. Call your doctor  or health careprofessional if you are unable to keep an appointment. Depot injections: Depot injections are given either once-monthly, every 12 weeks, every 16 weeks, or every 24 weeks depending on the product you are prescribed. The product you are prescribed will be based on if you are male orfemale, and your condition. Make sure you understand your product and dosing. What may interact with this medication? Do not take this medicine with any of the following medications: chasteberry cisapride dronedarone pimozide thioridazine This medicine may also interact with the following medications: herbal or dietary supplements, like black cohosh or DHEA male hormones, like estrogens or progestins and birth control pills, patches, rings, or injections male hormones, like testosterone other medicines that prolong the QT interval (abnormal heart rhythm) This list may not describe all possible interactions. Give your health care provider a list of all the medicines, herbs, non-prescription drugs, or dietary supplements you use. Also tell them if you smoke, drink alcohol, or use illegaldrugs. Some items may interact with your medicine. What should I watch for while using this medication? Visit your doctor or health care professional for regular checks on your progress. During the first weeks of treatment, your symptoms may get worse, but then will improve as you continue your treatment. You may get hot flashes, increased bone pain, increased difficulty passing urine, or an aggravation of nerve symptoms. Discuss these effects with your doctor or health careprofessional, some of them may improve with continued use of this medicine. Male patients may experience a menstrual cycle or spotting during the first months of therapy with this medicine. If this continues, contact your doctor orhealth care professional. This medicine may increase blood sugar. Ask  your healthcare provider if changesin diet or medicines  are needed if you have diabetes. What side effects may I notice from receiving this medication? Side effects that you should report to your doctor or health care professionalas soon as possible: allergic reactions like skin rash, itching or hives, swelling of the face, lips, or tongue breathing problems chest pain depression or memory disorders pain in your legs or groin pain at site where injected or implanted seizures severe headache signs and symptoms of high blood sugar such as being more thirsty or hungry or having to urinate more than normal. You may also feel very tired or have blurry vision swelling of the feet and legs suicidal thoughts or other mood changes visual changes vomiting Side effects that usually do not require medical attention (report to yourdoctor or health care professional if they continue or are bothersome): breast swelling or tenderness decrease in sex drive or performance diarrhea hot flashes loss of appetite muscle, joint, or bone pains nausea redness or irritation at site where injected or implanted skin problems or acne This list may not describe all possible side effects. Call your doctor for medical advice about side effects. You may report side effects to FDA at1-800-FDA-1088. Where should I keep my medication? This drug is given in a hospital or clinic and will not be stored at home. NOTE: This sheet is a summary. It may not cover all possible information. If you have questions about this medicine, talk to your doctor, pharmacist, orhealth care provider.  2022 Elsevier/Gold Standard (2019-07-31 10:35:13)

## 2021-04-17 LAB — PROSTATE-SPECIFIC AG, SERUM (LABCORP): Prostate Specific Ag, Serum: <0.1 ng/mL (ref 0.0–4.0)

## 2021-04-23 ENCOUNTER — Inpatient Hospital Stay (HOSPITAL_BASED_OUTPATIENT_CLINIC_OR_DEPARTMENT_OTHER): Payer: Medicare Other | Admitting: Oncology

## 2021-04-23 ENCOUNTER — Other Ambulatory Visit: Payer: Self-pay

## 2021-04-23 VITALS — BP 144/75 | HR 61 | Temp 98.3°F | Resp 20 | Ht 69.5 in | Wt 215.7 lb

## 2021-04-23 DIAGNOSIS — C61 Malignant neoplasm of prostate: Secondary | ICD-10-CM | POA: Diagnosis not present

## 2021-04-23 DIAGNOSIS — Z5111 Encounter for antineoplastic chemotherapy: Secondary | ICD-10-CM | POA: Diagnosis not present

## 2021-04-23 NOTE — Progress Notes (Signed)
Hematology and Oncology Follow Up Visit  Raymond Huang [id number] 16-Nov-1945 75 y.o. 04/23/2021 1:07 PM Pcp, Hoyle Sauer, MD   Principle Diagnosis: 75 year old man with prostate cancer diagnosed in April 2022.  He was found to have a Gleason score 5+4 = 9, PSA 4.5.  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   Prior Therapy:  He is status post radiation therapy under the care of Dr. Tammi Klippel.  He completed a total of 60 Gray to the prostate as well as to the oligometastatic left sacral lesion.  Therapy completed in July 2022.  Current therapy:   Androgen deprivation therapy with Eligard every 3 months.  Zytiga 1000 mg daily with prednisone 5 mg daily.  Interim History: Raymond Huang returns today for a follow-up visit.  Since her last visit, he completed radiation therapy without any major complications.  He did report some fatigue and tiredness as well as diarrhea which has resolved at this time.  He continues to tolerate Zytiga without any major complaints.  He denies any nausea, vomiting or abdominal pain.  He denies any worsening fatigue or edema.     Medications: I have reviewed the patient's current medications.  Current Outpatient Medications  Medication Sig Dispense Refill   abiraterone acetate (ZYTIGA) 250 MG tablet Take 4 tablets (1,000 mg total) by mouth daily. Take on an empty stomach 1 hour before or 2 hours after a meal 120 tablet 0   ammonium lactate (LAC-HYDRIN) 12 % lotion Apply topically.     aspirin 81 MG EC tablet Take by mouth.     atenolol (TENORMIN) 50 MG tablet Take 50 mg by mouth 2 (two) times daily.     Coenzyme Q10 200 MG capsule Take by mouth.     ketoconazole (NIZORAL) 2 % shampoo Apply topically.     lisinopril-hydrochlorothiazide (ZESTORETIC) 20-12.5 MG tablet Take 1 tablet by mouth daily.     mometasone (ELOCON) 0.1 % lotion Apply 0.1 application topically 1 day or 1 dose.     Multiple  Vitamin (MULTIVITAMINS PO) Take by mouth.     pravastatin (PRAVACHOL) 40 MG tablet Take 40 mg by mouth daily.     predniSONE (DELTASONE) 5 MG tablet Take 1 tablet (5 mg total) by mouth daily with breakfast. 90 tablet 3   rivaroxaban (XARELTO) 20 MG TABS tablet TAKE 1 TABLET DAILY WITH DINNER     No current facility-administered medications for this visit.     Allergies: No Known Allergies    Physical Exam: Blood pressure (!) 144/75, pulse 61, temperature 98.3 F (36.8 C), temperature source Oral, resp. rate 20, height 5' 9.5" (1.765 m), weight 215 lb 11.2 oz (97.8 kg), SpO2 99 %. ECOG: 1   General appearance: Comfortable appearing without any discomfort Head: Normocephalic without any trauma Oropharynx: Mucous membranes are moist and pink without any thrush or ulcers. Eyes: Pupils are equal and round reactive to light. Lymph nodes: No cervical, supraclavicular, inguinal or axillary lymphadenopathy.   Heart:regular rate and rhythm.  S1 and S2 without leg edema. Lung: Clear without any rhonchi or wheezes.  No dullness to percussion. Abdomin: Soft, nontender, nondistended with good bowel sounds.  No hepatosplenomegaly. Musculoskeletal: No joint deformity or effusion.  Full range of motion noted. Neurological: No deficits noted on motor, sensory and deep tendon reflex exam. Skin: No petechial rash or dryness.  Appeared moist.     Lab Results: Lab Results  Component Value Date   WBC 6.0 04/16/2021  HGB 11.3 (L) 04/16/2021   HCT 31.4 (L) 04/16/2021   MCV 95.4 04/16/2021   PLT 225 04/16/2021     Chemistry      Component Value Date/Time   NA 136 04/16/2021 0928   K 4.4 04/16/2021 0928   CL 103 04/16/2021 0928   CO2 24 04/16/2021 0928   BUN 29 (H) 04/16/2021 0928   CREATININE 1.43 (H) 04/16/2021 0928      Component Value Date/Time   CALCIUM 9.6 04/16/2021 0928   ALKPHOS 47 04/16/2021 0928   AST 29 04/16/2021 0928   ALT 32 04/16/2021 0928   BILITOT 1.2 04/16/2021 0928           Impression and Plan:  75 year old man with:   1.  Advanced prostate cancer with isolated metastatic bone lesion to the left sacrum diagnosed in March 2022.  He has castration-sensitive disease.   He has completed definitive therapy to the prostate as well as the isolated area of metastasis without any major complaints.  Risks and benefits of continuing Zytiga at this time were discussed.  Complications including hypertension and edema were reiterated.  The duration of therapy is to be determined at this time likely indefinite but at least 2 years.     2.  Androgen depravation: This will be repeated in 3 months.  Complications that include weight gain and hot flashes were discussed.   3.  Bone directed therapy: Delton See is deferred at this time given his limited bone disease.  I recommended calcium and vitamin D supplements.   4.  Hypertension: His blood pressure remains adequately controlled on Zytiga.  We will continue to monitor.     5.  Goals of care and prognosis: Therapy remains palliative although aggressive measures are warranted for possible curative intent.   6.  Follow-up: In 3 months for repeat evaluation.   30  minutes were spent on this encounter.  The time was dedicated to reviewing laboratory data, disease status update and addressing complications related to cancer and cancer therapy.      Zola Button, MD 8/12/20221:07 PM

## 2021-04-27 ENCOUNTER — Other Ambulatory Visit (HOSPITAL_COMMUNITY): Payer: Self-pay

## 2021-04-27 ENCOUNTER — Other Ambulatory Visit: Payer: Self-pay | Admitting: Oncology

## 2021-04-27 MED ORDER — ABIRATERONE ACETATE 250 MG PO TABS
1000.0000 mg | ORAL_TABLET | Freq: Every day | ORAL | 0 refills | Status: DC
  Filled 2021-05-10: qty 120, 30d supply, fill #0

## 2021-04-30 ENCOUNTER — Other Ambulatory Visit (HOSPITAL_COMMUNITY): Payer: Self-pay

## 2021-05-10 ENCOUNTER — Other Ambulatory Visit (HOSPITAL_COMMUNITY): Payer: Self-pay

## 2021-05-11 ENCOUNTER — Telehealth: Payer: Self-pay

## 2021-05-11 NOTE — Telephone Encounter (Signed)
Referral notes received from [facility], Phone #: [phone], Fax #: [phone]   A copy of the notes have been placed in the scheduling box for check-out to pick-up and to enter referral. Original notes placed in file cabinet.

## 2021-05-13 ENCOUNTER — Telehealth: Payer: Self-pay | Admitting: Oncology

## 2021-05-13 NOTE — Telephone Encounter (Signed)
Sch per 8/12 los, pt aware

## 2021-05-18 ENCOUNTER — Encounter: Payer: Self-pay | Admitting: Urology

## 2021-05-18 NOTE — Progress Notes (Signed)
Patient reports exertion induced shortness of breath, nocturia x3, and mild urinary frequency/ urgency. No other symptoms to report at this time.  Meaningful use complete. I-PSS Score of 8 (moderate).  Patient informed of 9:00am telephone visit on 05/19/21 and expressed understanding.

## 2021-05-19 ENCOUNTER — Ambulatory Visit
Admission: RE | Admit: 2021-05-19 | Discharge: 2021-05-19 | Disposition: A | Discharge status: Home / Self Care | Payer: Medicare Other | Source: Ambulatory Visit | Attending: Urology | Admitting: Urology

## 2021-05-19 ENCOUNTER — Encounter: Payer: Self-pay | Admitting: Oncology

## 2021-05-19 DIAGNOSIS — C61 Malignant neoplasm of prostate: Secondary | ICD-10-CM | POA: Insufficient documentation

## 2021-05-19 NOTE — Progress Notes (Signed)
Radiation Oncology         [phone]) [phone] ________________________________  Name: Raymond Huang. MRN: [id number]  Date: 05/19/2021  DOB: 1945-09-17  Post Treatment Note  CC: Pcp, No  No ref. provider found  Diagnosis:   75 y.o. 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   Interval Since Last Radiation:  6 weeks  03/10/21 - 04/08/21:  The prostate, seminal vesicles, pelvic lymph nodes and pelvic bone metastasis were initially treated to 44 Gy in 20 fractions of 2.2 Gy, using a simultaneous integrated boost (SIB) to the prostate and pelvic bone oligometastasis to a total dose of 60 Gy with 20 fractions of 3.0 Gy; concurrent with ADT and Zytiga.   Narrative:  I spoke with the patient to conduct his/routine scheduled 1 month follow up visit via telephone to spare the patient unnecessary potential exposure in the healthcare setting during the current COVID-19 pandemic.  The patient was notified in advance and gave permission to proceed with this visit format. He tolerated radiation treatment relatively well with only minor urinary irritation, diarrhea and modest fatigue.                                On review of systems, the patient states that he is doing well in general.  His LUTS continue to improve and are manageable at this point.  His current IPSS score is 8, indicating mild-moderate urinary symptoms only.  He specifically denies dysuria, gross hematuria, straining to void, incomplete bladder emptying or incontinence.  He does continue with significant fatigue but reports that this is gradually improving as well.  He has continued to tolerate the ADT fairly well, with his last injection on 04/16/2021 and next scheduled injection on 07/15/2021.  He denies any abdominal pain, nausea, vomiting, diarrhea or constipation.  He reports a healthy appetite and is maintaining his weight.   Overall, he is pleased with his progress to date.  ALLERGIES:  has No Known Allergies.  Meds: Current Outpatient Medications  Medication Sig Dispense Refill   abiraterone acetate (ZYTIGA) 250 MG tablet Take 4 tablets (1,000 mg total) by mouth daily. Take on an empty stomach 1 hour before or 2 hours after a meal 120 tablet 0   ammonium lactate (LAC-HYDRIN) 12 % lotion Apply topically.     aspirin 81 MG EC tablet Take by mouth.     atenolol (TENORMIN) 50 MG tablet Take 50 mg by mouth 2 (two) times daily.     Coenzyme Q10 200 MG capsule Take by mouth.     ketoconazole (NIZORAL) 2 % shampoo Apply topically.     lisinopril-hydrochlorothiazide (ZESTORETIC) 20-12.5 MG tablet Take 1 tablet by mouth daily.     mometasone (ELOCON) 0.1 % lotion Apply 0.1 application topically 1 day or 1 dose.     Multiple Vitamin (MULTIVITAMINS PO) Take by mouth.     pravastatin (PRAVACHOL) 40 MG tablet Take 40 mg by mouth daily.     predniSONE (DELTASONE) 5 MG tablet Take 1 tablet (5 mg total) by mouth daily with breakfast. 90 tablet 3   rivaroxaban (XARELTO) 20 MG TABS tablet TAKE 1 TABLET DAILY WITH DINNER     No current facility-administered medications for this encounter.    Physical Findings:  vitals were not taken for this visit.  Pain Assessment Pain Score:  0-No pain/10 Unable to assess due to telephone follow-up visit format.  Lab Findings: Lab Results  Component Value Date   WBC 6.0 04/16/2021   HGB 11.3 (L) 04/16/2021   HCT 31.4 (L) 04/16/2021   MCV 95.4 04/16/2021   PLT 225 04/16/2021     Radiographic Findings: No results found.  Impression/Plan: 1. 75 y.o. 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  He will continue to follow up with Dr. Alen Blew for ongoing PSA determinations and management of his systemic disease.  He has an appointment scheduled for his next Eligard  ADT injection on 07/15/2021 and will see Dr. Alen Blew the following week.  He plans to continue taking the Zytiga as prescribed.  He understands what to expect with regards to PSA monitoring going forward. I will look forward to following his response to treatment via correspondence with urology, and would be happy to continue to participate in his care if clinically indicated. I talked to the patient about what to expect in the future, including his risk for erectile dysfunction and rectal bleeding. I encouraged him to call or return to the office if he has any questions regarding his previous radiation or possible radiation side effects. He was comfortable with this plan and will follow up as needed.     Nicholos Johns, PA-C

## 2021-05-19 NOTE — Progress Notes (Signed)
  Radiation Oncology         [phone]) [phone] ________________________________  Name: Raymond Huang. MRN: [id number]  Date: 04/08/2021  DOB: Dec 14, 1945  End of Treatment Note  Diagnosis:   75 y.o. 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     Indication for treatment:  Curative, Definitive Radiotherapy       Radiation treatment dates:   03/10/21 - 04/08/21  Site/dose:   The prostate, seminal vesicles, pelvic lymph nodes and pelvic bone metastasis were initially treated to 44 Gy in 20 fractions of 2.2 Gy, using a simultaneous integrated boost (SIB) to the prostate and pelvic bone oligometastasis to a total dose of 60 Gy with 20 fractions of 3.0 Gy.  Beams/energy:   The patient was treated with VMAT IMRT using volumetric arc therapy delivering 6 MV X-rays to clockwise and counterclockwise circumferential arcs with a 90 degree collimator offset to avoid dose scalloping.  Image guidance was performed with daily cone beam CT prior to each fraction to align to gold markers in the prostate and assure proper bladder and rectal fill volumes.  Immobilization was achieved with [employer] custom mold.  Narrative: The patient tolerated radiation treatment relatively well with only minor urinary irritation, diarrhea and modest fatigue.    Plan: The patient has completed radiation treatment. He will return to radiation oncology clinic for routine followup in one month. I advised him to call or return sooner if he has any questions or concerns related to his recovery or treatment. ________________________________  Sheral Apley. Tammi Klippel, M.D.

## 2021-05-27 ENCOUNTER — Other Ambulatory Visit: Payer: Self-pay | Admitting: Urology

## 2021-05-27 DIAGNOSIS — C61 Malignant neoplasm of prostate: Secondary | ICD-10-CM

## 2021-05-31 ENCOUNTER — Other Ambulatory Visit (HOSPITAL_COMMUNITY): Payer: Self-pay

## 2021-05-31 ENCOUNTER — Other Ambulatory Visit: Payer: Self-pay | Admitting: Oncology

## 2021-05-31 MED ORDER — ABIRATERONE ACETATE 250 MG PO TABS
1000.0000 mg | ORAL_TABLET | Freq: Every day | ORAL | 0 refills | Status: DC
  Filled 2021-05-31: qty 120, 30d supply, fill #0

## 2021-06-03 ENCOUNTER — Other Ambulatory Visit (HOSPITAL_COMMUNITY): Payer: Self-pay

## 2021-06-15 ENCOUNTER — Telehealth: Payer: Self-pay | Admitting: Adult Health

## 2021-06-15 ENCOUNTER — Telehealth: Payer: Self-pay | Admitting: *Deleted

## 2021-06-15 NOTE — Telephone Encounter (Signed)
Per 10/4 sch msg, pt was called and confirmed appt

## 2021-06-28 ENCOUNTER — Other Ambulatory Visit (HOSPITAL_COMMUNITY): Payer: Self-pay

## 2021-06-28 ENCOUNTER — Other Ambulatory Visit: Payer: Self-pay | Admitting: Oncology

## 2021-06-28 MED ORDER — ABIRATERONE ACETATE 250 MG PO TABS
1000.0000 mg | ORAL_TABLET | Freq: Every day | ORAL | 0 refills | Status: DC
  Filled 2021-06-28: qty 120, 30d supply, fill #0

## 2021-07-06 ENCOUNTER — Other Ambulatory Visit (HOSPITAL_COMMUNITY): Payer: Self-pay

## 2021-07-08 ENCOUNTER — Other Ambulatory Visit (HOSPITAL_COMMUNITY): Payer: Self-pay

## 2021-07-15 ENCOUNTER — Inpatient Hospital Stay: Payer: Medicare Other

## 2021-07-15 ENCOUNTER — Inpatient Hospital Stay: Payer: Medicare Other | Attending: Oncology

## 2021-07-15 ENCOUNTER — Other Ambulatory Visit: Payer: Self-pay

## 2021-07-15 VITALS — BP 152/77 | HR 62 | Temp 98.9°F | Resp 18

## 2021-07-15 DIAGNOSIS — C7951 Secondary malignant neoplasm of bone: Secondary | ICD-10-CM | POA: Insufficient documentation

## 2021-07-15 DIAGNOSIS — E291 Testicular hypofunction: Secondary | ICD-10-CM | POA: Insufficient documentation

## 2021-07-15 DIAGNOSIS — Z79899 Other long term (current) drug therapy: Secondary | ICD-10-CM | POA: Insufficient documentation

## 2021-07-15 DIAGNOSIS — I10 Essential (primary) hypertension: Secondary | ICD-10-CM | POA: Insufficient documentation

## 2021-07-15 DIAGNOSIS — Z5111 Encounter for antineoplastic chemotherapy: Secondary | ICD-10-CM | POA: Insufficient documentation

## 2021-07-15 DIAGNOSIS — Z923 Personal history of irradiation: Secondary | ICD-10-CM | POA: Diagnosis not present

## 2021-07-15 DIAGNOSIS — C61 Malignant neoplasm of prostate: Secondary | ICD-10-CM | POA: Diagnosis present

## 2021-07-15 LAB — CBC WITH DIFFERENTIAL (CANCER CENTER ONLY)
Abs Immature Granulocytes: 0.01 10*3/uL (ref 0.00–0.07)
Basophils Absolute: 0.1 10*3/uL (ref 0.0–0.1)
Basophils Relative: 1 %
Eosinophils Absolute: 0.1 10*3/uL (ref 0.0–0.5)
Eosinophils Relative: 2 %
HCT: 33.8 % — ABNORMAL LOW (ref 39.0–52.0)
Hemoglobin: 12.2 g/dL — ABNORMAL LOW (ref 13.0–17.0)
Immature Granulocytes: 0 %
Lymphocytes Relative: 11 %
Lymphs Abs: 0.6 10*3/uL — ABNORMAL LOW (ref 0.7–4.0)
MCH: 33.3 pg (ref 26.0–34.0)
MCHC: 36.1 g/dL — ABNORMAL HIGH (ref 30.0–36.0)
MCV: 92.3 fL (ref 80.0–100.0)
Monocytes Absolute: 0.5 10*3/uL (ref 0.1–1.0)
Monocytes Relative: 8 %
Neutro Abs: 4.4 10*3/uL (ref 1.7–7.7)
Neutrophils Relative %: 78 %
Platelet Count: 179 10*3/uL (ref 150–400)
RBC: 3.66 MIL/uL — ABNORMAL LOW (ref 4.22–5.81)
RDW: 12.1 % (ref 11.5–15.5)
WBC Count: 5.6 10*3/uL (ref 4.0–10.5)
nRBC: 0 % (ref 0.0–0.2)

## 2021-07-15 LAB — CMP (CANCER CENTER ONLY)
ALT: 22 U/L (ref 0–44)
AST: 25 U/L (ref 15–41)
Albumin: 4 g/dL (ref 3.5–5.0)
Alkaline Phosphatase: 47 U/L (ref 38–126)
Anion gap: 9 (ref 5–15)
BUN: 23 mg/dL (ref 8–23)
CO2: 26 mmol/L (ref 22–32)
Calcium: 9.5 mg/dL (ref 8.9–10.3)
Chloride: 104 mmol/L (ref 98–111)
Creatinine: 1.03 mg/dL (ref 0.61–1.24)
GFR, Estimated: >60 mL/min (ref >60)
Glucose, Bld: 120 mg/dL — ABNORMAL HIGH (ref 70–99)
Potassium: 4.1 mmol/L (ref 3.5–5.1)
Sodium: 139 mmol/L (ref 135–145)
Total Bilirubin: 1.1 mg/dL (ref 0.3–1.2)
Total Protein: 6.7 g/dL (ref 6.5–8.1)

## 2021-07-15 MED ORDER — LEUPROLIDE ACETATE (3 MONTH) 22.5 MG ~~LOC~~ KIT
22.5000 mg | PACK | Freq: Once | SUBCUTANEOUS | Status: AC
  Administered 2021-07-15: 22.5 mg via SUBCUTANEOUS
  Filled 2021-07-15: qty 22.5

## 2021-07-15 NOTE — Patient Instructions (Signed)
Leuprolide depot injection What is this medication? LEUPROLIDE (loo PROE lide) is a man-made protein that acts like a natural hormone in the body. It decreases testosterone in men and decreases estrogen in women. In men, this medicine is used to treat advanced prostate cancer. In women, some forms of this medicine may be used to treat endometriosis, uterine fibroids, or other male hormone-related problems. This medicine may be used for other purposes; ask your health care provider or pharmacist if you have questions. COMMON BRAND NAME(S): Eligard, Fensolv, Lupron Depot, Lupron Depot-Ped, Viadur What should I tell my care team before I take this medication? They need to know if you have any of these conditions: diabetes heart disease or previous heart attack high blood pressure high cholesterol mental illness osteoporosis pain or difficulty passing urine seizures spinal cord metastasis stroke suicidal thoughts, plans, or attempt; a previous suicide attempt by you or a family member tobacco smoker unusual vaginal bleeding (women) an unusual or allergic reaction to leuprolide, benzyl alcohol, other medicines, foods, dyes, or preservatives pregnant or trying to get pregnant breast-feeding How should I use this medication? This medicine is for injection into a muscle or for injection under the skin. It is given by a health care professional in a hospital or clinic setting. The specific product will determine how it will be given to you. Make sure you understand which product you receive and how often you will receive it. Talk to your pediatrician regarding the use of this medicine in children. Special care may be needed. Overdosage: If you think you have taken too much of this medicine contact a poison control center or emergency room at once. NOTE: This medicine is only for you. Do not share this medicine with others. What if I miss a dose? It is important not to miss a dose. Call your  doctor or health care professional if you are unable to keep an appointment. Depot injections: Depot injections are given either once-monthly, every 12 weeks, every 16 weeks, or every 24 weeks depending on the product you are prescribed. The product you are prescribed will be based on if you are male or male, and your condition. Make sure you understand your product and dosing. What may interact with this medication? Do not take this medicine with any of the following medications: chasteberry cisapride dronedarone pimozide thioridazine This medicine may also interact with the following medications: herbal or dietary supplements, like black cohosh or DHEA male hormones, like estrogens or progestins and birth control pills, patches, rings, or injections male hormones, like testosterone other medicines that prolong the QT interval (abnormal heart rhythm) This list may not describe all possible interactions. Give your health care provider a list of all the medicines, herbs, non-prescription drugs, or dietary supplements you use. Also tell them if you smoke, drink alcohol, or use illegal drugs. Some items may interact with your medicine. What should I watch for while using this medication? Visit your doctor or health care professional for regular checks on your progress. During the first weeks of treatment, your symptoms may get worse, but then will improve as you continue your treatment. You may get hot flashes, increased bone pain, increased difficulty passing urine, or an aggravation of nerve symptoms. Discuss these effects with your doctor or health care professional, some of them may improve with continued use of this medicine. Male patients may experience a menstrual cycle or spotting during the first months of therapy with this medicine. If this continues, contact your doctor or  health care professional. This medicine may increase blood sugar. Ask your healthcare provider if changes in diet  or medicines are needed if you have diabetes. What side effects may I notice from receiving this medication? Side effects that you should report to your doctor or health care professional as soon as possible: allergic reactions like skin rash, itching or hives, swelling of the face, lips, or tongue breathing problems chest pain depression or memory disorders pain in your legs or groin pain at site where injected or implanted seizures severe headache signs and symptoms of high blood sugar such as being more thirsty or hungry or having to urinate more than normal. You may also feel very tired or have blurry vision swelling of the feet and legs suicidal thoughts or other mood changes visual changes vomiting Side effects that usually do not require medical attention (report to your doctor or health care professional if they continue or are bothersome): breast swelling or tenderness decrease in sex drive or performance diarrhea hot flashes loss of appetite muscle, joint, or bone pains nausea redness or irritation at site where injected or implanted skin problems or acne This list may not describe all possible side effects. Call your doctor for medical advice about side effects. You may report side effects to FDA at 1-800-FDA-1088. Where should I keep my medication? This drug is given in a hospital or clinic and will not be stored at home. NOTE: This sheet is a summary. It may not cover all possible information. If you have questions about this medicine, talk to your doctor, pharmacist, or health care provider.  2022 Elsevier/Gold Standard (2019-07-31 10:35:13)

## 2021-07-16 LAB — PROSTATE-SPECIFIC AG, SERUM (LABCORP): Prostate Specific Ag, Serum: <0.1 ng/mL (ref 0.0–4.0)

## 2021-07-22 ENCOUNTER — Other Ambulatory Visit: Payer: Self-pay

## 2021-07-22 ENCOUNTER — Inpatient Hospital Stay: Payer: Medicare Other | Admitting: Oncology

## 2021-07-22 VITALS — BP 121/89 | HR 80 | Temp 98.5°F | Resp 16 | Ht 68.0 in | Wt 222.1 lb

## 2021-07-22 DIAGNOSIS — Z5111 Encounter for antineoplastic chemotherapy: Secondary | ICD-10-CM | POA: Diagnosis not present

## 2021-07-22 DIAGNOSIS — C61 Malignant neoplasm of prostate: Secondary | ICD-10-CM

## 2021-07-22 NOTE — Progress Notes (Signed)
Hematology and Oncology Follow Up Visit  Raymond Huang [id number] [birth year] 75 y.o. 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  He was found to have a Gleason score 5+4 = 9, PSA 4.5.    Prior Therapy:  He is status post radiation therapy under the care of Dr. Tammi Klippel.  He completed a total of 60 Gray to the prostate as well as to the oligometastatic left sacral lesion.  Therapy completed in July 2022.  Current therapy:   Eligard 22.5 mg every 3 months.  Next injection will be given in February 2022.  Zytiga 1000 mg daily with prednisone 5 mg daily.  Interim History: Raymond Huang presents today for a follow-up visit.  Since her last visit, he reports no more changes in his health.  He continues to tolerate Zytiga without any complaints.  He denies any nausea, vomiting or abdominal pain.  He denies any excessive fatigue or tiredness.  He denies any bone pain or pathological fractures.  He continues to exercise regularly at this time.  He has reported some mild fatigue and dyspnea on inclines but still able to workout regularly on a treadmill.     Medications: Updated on review. Current Outpatient Medications  Medication Sig Dispense Refill   abiraterone acetate (ZYTIGA) 250 MG tablet Take 4 tablets (1,000 mg total) by mouth daily. Take on an empty stomach 1 hour before or 2 hours after a meal 120 tablet 0   ammonium lactate (LAC-HYDRIN) 12 % lotion Apply topically.     aspirin 81 MG EC tablet Take by mouth.     atenolol (TENORMIN) 50 MG tablet Take 50 mg by mouth 2 (two) times daily.     Coenzyme Q10 200 MG capsule Take by mouth.     ketoconazole (NIZORAL) 2 % shampoo Apply topically.     lisinopril-hydrochlorothiazide (ZESTORETIC) 20-12.5 MG tablet Take 1 tablet by mouth daily.     mometasone (ELOCON) 0.1 % lotion Apply 0.1 application  topically 1 day or 1 dose.     Multiple Vitamin (MULTIVITAMINS PO) Take by mouth.     pravastatin (PRAVACHOL) 40 MG tablet Take 40 mg by mouth daily.     predniSONE (DELTASONE) 5 MG tablet Take 1 tablet (5 mg total) by mouth daily with breakfast. 90 tablet 3   rivaroxaban (XARELTO) 20 MG TABS tablet TAKE 1 TABLET DAILY WITH DINNER     No current facility-administered medications for this visit.     Allergies: No Known Allergies    Physical Exam: Blood pressure 121/89, pulse 80, temperature 98.5 F (36.9 C), temperature source Oral, resp. rate 16, height 5\' 8"  (1.727 m), weight 222 lb 1.6 oz (100.7 kg), SpO2 99 %.  ECOG: 1   General appearance: Alert, awake without any distress. Head: Atraumatic without abnormalities Oropharynx: Without any thrush or ulcers. Eyes: No scleral icterus. Lymph nodes: No lymphadenopathy noted in the cervical, supraclavicular, or axillary nodes Heart:regular rate and rhythm, without any murmurs or gallops.   Lung: Clear to auscultation without any rhonchi, wheezes or dullness to percussion. Abdomin: Soft, nontender without any shifting dullness or ascites. Musculoskeletal: No clubbing or cyanosis. Neurological: No motor or sensory deficits. Skin: No rashes or lesions.     Lab Results: Lab Results  Component Value Date   WBC 5.6 07/15/2021   HGB 12.2 (L) 07/15/2021   HCT 33.8 (L) 07/15/2021   MCV  92.3 07/15/2021   PLT 179 07/15/2021     Chemistry      Component Value Date/Time   NA 139 07/15/2021 1051   K 4.1 07/15/2021 1051   CL 104 07/15/2021 1051   CO2 26 07/15/2021 1051   BUN 23 07/15/2021 1051   CREATININE 1.03 07/15/2021 1051      Component Value Date/Time   CALCIUM 9.5 07/15/2021 1051   ALKPHOS 47 07/15/2021 1051   AST 25 07/15/2021 1051   ALT 22 07/15/2021 1051   BILITOT 1.1 07/15/2021 1051          Impression and Plan:  74 year old man with:   1.  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     He continues to have excellent response to therapy with PSA is undetectable.  Risks and benefits of continuing this treatment for the time being were reviewed.  Duration of therapy would be 2 years possibly indefinite pending his response to therapy.  The plan is to update his staging scans at that time and will determine whether he will continue indefinite treatment.  He is agreeable with this plan.     2.  Androgen depravation: He had received injection on November 3 and will be repeated in 3 months.   3.  Bone directed therapy: I recommended calcium and vitamin D supplements for the time being.   4.  Hypertension: Blood pressure is within normal range and will continue to monitor on Zytiga.     5.  Goals of care and prognosis: Aggressive measures remains warranted at this time with curative intent.   6.  Follow-up: He will return in 3 months for a follow-up visit.   30  minutes were dedicated to this visit.  The time was spent on reviewing laboratory data, disease status update and outlining future plan of care review.      Zola Button, MD 11/10/20221:24 PM

## 2021-08-02 ENCOUNTER — Other Ambulatory Visit: Payer: Self-pay | Admitting: Oncology

## 2021-08-02 ENCOUNTER — Other Ambulatory Visit (HOSPITAL_COMMUNITY): Payer: Self-pay

## 2021-08-02 MED ORDER — ABIRATERONE ACETATE 250 MG PO TABS
1000.0000 mg | ORAL_TABLET | Freq: Every day | ORAL | 0 refills | Status: DC
  Filled 2021-08-02: qty 120, 30d supply, fill #0

## 2021-08-02 NOTE — Progress Notes (Signed)
Cardiology Office Note:    Date:  08/03/2021   ID:  Raymond Chol., DOB [birth year], MRN [id number]  PCP:  Pcp, No   [facility] Providers Cardiologist:  Jayme Mednick      Referring MD: Percell Belt, DO   Chief Complaint  Patient presents with   Atrial Fibrillation        Hypertension        Hyperlipidemia          History of Present Illness:    Raymond Accardo. is a 75 y.o. male with a hx of HLD, coronary artery disease HTN, atrial fibrillation and metastatic prostate cancer.  We are asked to see him today for further evaluation and management of his atrial fibrillation.  We were asked to see him by Dr. Rayann Heman for further evaluation of his HTN and HLD and atrial fibrillation.  Raymond Huang has a history of coronary artery disease.  He status post PCI in 1989.  5-6 years ago , was going to the gym regularly  His doctor found atrial fib.    Was started on xarelto  Was tried on Flecainide  Has been cardioverted x 3 [place]  Has been in sinus rhythm since that time . Recently moved to [place] ( his daughter lives here)   Was diagnosed with prostate cancer a year ago. 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 Is seeing oncolocy  Has lots of side effects ( fatigue) from his prostate meds.  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 Take on an empty stomach 1 hour before or 2 hours after a meal   ammonium lactate (LAC-HYDRIN) 12 % lotion Apply topically.   aspirin 81 MG  EC tablet Take by mouth.   atenolol (TENORMIN) 50 MG tablet Take 50 mg by mouth 2 (two) times daily.   Coenzyme Q10 200 MG capsule Take by mouth.   ketoconazole (NIZORAL) 2 % shampoo Apply topically.   Leuprolide Acetate, 3 Month, (ELIGARD) 22.5 MG injection Inject into the skin every 3 (three) months.   lisinopril-hydrochlorothiazide (ZESTORETIC) 20-12.5 MG tablet Take 1 tablet by mouth daily.   mometasone (ELOCON) 0.1 % lotion Apply 0.1 application topically 1 day or 1 dose.   Multiple Vitamin (MULTIVITAMINS PO) Take by mouth.   pravastatin (PRAVACHOL) 40 MG tablet Take 40 mg by mouth daily.   predniSONE (DELTASONE) 5 MG tablet Take 1 tablet (5 mg total) by mouth daily with breakfast.   rivaroxaban (XARELTO) 20 MG TABS tablet TAKE 1 TABLET DAILY WITH DINNER     Allergies:   Patient has no known allergies.   Social History   Socioeconomic History   Marital status: Widowed    Spouse name: Not on file   Number of children: 2   Years of education: Not on file   Highest education level: Not on file  Occupational History  Not on file  Tobacco Use   Smoking status: Never    Passive exposure: Past   Smokeless tobacco: Never  Vaping Use   Vaping Use: Every day  Substance and Sexual Activity   Alcohol use: Not Currently   Drug use: Never   Sexual activity: Not Currently  Other Topics Concern   Not on file  Social History Narrative   Not on file   Social Determinants of Health   Financial Resource Strain: Not on file  Food Insecurity: Not on file  Transportation Needs: Not on file  Physical Activity: Not on file  Stress: Not on file  Social Connections: Not on file     Family History: The patient's Family history is unknown by patient.  ROS:   Please see the history of present illness.     All other systems reviewed and are negative.  EKGs/Labs/Other Studies Reviewed:    The following studies were reviewed today:   EKG:   August 03, 2021: Sinus bradycardia 51.   First-degree AV block.  No ST or T wave abnormalities.  Recent Labs: 07/15/2021: ALT 22; BUN 23; Creatinine 1.03; Hemoglobin 12.2; Platelet Count 179; Potassium 4.1; Sodium 139  Recent Lipid Panel No results found for: CHOL, TRIG, HDL, CHOLHDL, VLDL, LDLCALC, LDLDIRECT   Risk Assessment/Calculations:    CHA2DS2-VASc Score = 3   This indicates a 3.2% annual risk of stroke. The patient's score is based upon: CHF History: 0 HTN History: 1 Diabetes History: 0 Stroke History: 0 Vascular Disease History: 0 Age Score: 2 Gender Score: 0         Physical Exam:    VS:  BP 136/78 (BP Location: Left Arm, Patient Position: Sitting, Cuff Size: Normal)   Pulse (!) 51   Ht 5\' 9"  (1.753 m)   Wt 224 lb 12.8 oz (102 kg)   SpO2 99%   BMI 33.20 kg/m     Wt Readings from Last 3 Encounters:  08/03/21 224 lb 12.8 oz (102 kg)  07/22/21 222 lb 1.6 oz (100.7 kg)  05/11/21 215 lb (97.5 kg)     GEN:  Well nourished, well developed in no acute distress HEENT: Normal NECK: No JVD; No carotid bruits LYMPHATICS: No lymphadenopathy CARDIAC: RRR, no murmurs, rubs, gallops RESPIRATORY:  Clear to auscultation without rales, wheezing or rhonchi  ABDOMEN: Soft, non-tender, non-distended MUSCULOSKELETAL:  No edema; No deformity  SKIN: Warm and dry NEUROLOGIC:  Alert and oriented x 3 PSYCHIATRIC:  Normal affect   ASSESSMENT:    1. Primary hypertension   2. Mixed hyperlipidemia   3. Atrial fibrillation, unspecified type (Post)    PLAN:    In order of problems listed above:  Atrial fibrillation: He remains in normal sinus rhythm now.  Remains on Xarelto.  Continue current medications.  He status post A. fib ablation.  2.  Hyperlipidemia: We will check lipids today.  He is at his basic metabolic profile and liver enzymes checked regularly.  3.  Hypertension: Blood pressure remains well controlled.           Medication Adjustments/Labs and Tests Ordered: Current medicines are reviewed at  length with the patient today.  Concerns regarding medicines are outlined above.  Orders Placed This Encounter  Procedures   Lipid panel   EKG 12-Lead    No orders of the defined types were placed in this encounter.   Patient Instructions  Medication Instructions:  Your physician recommends that you continue on your current medications as directed. Please  refer to the Current Medication list given to you today.  *If you need a refill on your cardiac medications before your next appointment, please call your pharmacy*   Lab Work: Lipid today  If you have labs (blood work) drawn today and your tests are completely normal, you will receive your results only by: [place] (if you have MyChart) OR A paper copy in the mail If you have any lab test that is abnormal or we need to change your treatment, we will call you to review the results.   Testing/Procedures: None   Follow-Up: At [facility], you and your health needs are our priority.  As part of our continuing mission to provide you with exceptional heart care, we have created designated Provider Care Teams.  These Care Teams include your primary Cardiologist (physician) and Advanced Practice Providers (APPs -  Physician Assistants and Nurse Practitioners) who all work together to provide you with the care you need, when you need it.  We recommend signing up for the patient portal called "MyChart".  Sign up information is provided on this After Visit Summary.  MyChart is used to connect with patients for Virtual Visits (Telemedicine).  Patients are able to view lab/test results, encounter notes, upcoming appointments, etc.  Non-urgent messages can be sent to your provider as well.   To learn more about what you can do with MyChart, go to [email or web address].    Your next appointment:   1 year(s)  The format for your next appointment:   In Person  Provider:   Mertie Moores, MD    Other Instructions      Signed, Raymond Moores, MD  08/03/2021 9:15 AM    [place]

## 2021-08-03 ENCOUNTER — Ambulatory Visit: Payer: Medicare Other | Admitting: Cardiovascular Disease

## 2021-08-03 ENCOUNTER — Encounter: Payer: Self-pay | Admitting: Cardiovascular Disease

## 2021-08-03 ENCOUNTER — Other Ambulatory Visit: Payer: Self-pay

## 2021-08-03 VITALS — BP 136/78 | HR 51 | Ht 69.0 in | Wt 224.8 lb

## 2021-08-03 DIAGNOSIS — I10 Essential (primary) hypertension: Secondary | ICD-10-CM

## 2021-08-03 DIAGNOSIS — E782 Mixed hyperlipidemia: Secondary | ICD-10-CM | POA: Diagnosis not present

## 2021-08-03 DIAGNOSIS — I4891 Unspecified atrial fibrillation: Secondary | ICD-10-CM | POA: Diagnosis not present

## 2021-08-03 LAB — LIPID PANEL
Chol/HDL Ratio: 2.4 ratio (ref 0.0–5.0)
Cholesterol, Total: 142 mg/dL (ref 100–199)
HDL: 60 mg/dL (ref >39)
LDL Chol Calc (NIH): 65 mg/dL (ref 0–99)
Triglycerides: 90 mg/dL (ref 0–149)
VLDL Cholesterol Cal: 17 mg/dL (ref 5–40)

## 2021-08-03 NOTE — Patient Instructions (Signed)
Medication Instructions:  Your physician recommends that you continue on your current medications as directed. Please refer to the Current Medication list given to you today.  *If you need a refill on your cardiac medications before your next appointment, please call your pharmacy*   Lab Work: Lipid today  If you have labs (blood work) drawn today and your tests are completely normal, you will receive your results only by: [place] (if you have MyChart) OR A paper copy in the mail If you have any lab test that is abnormal or we need to change your treatment, we will call you to review the results.   Testing/Procedures: None   Follow-Up: At [facility], you and your health needs are our priority.  As part of our continuing mission to provide you with exceptional heart care, we have created designated Provider Care Teams.  These Care Teams include your primary Cardiologist (physician) and Advanced Practice Providers (APPs -  Physician Assistants and Nurse Practitioners) who all work together to provide you with the care you need, when you need it.  We recommend signing up for the patient portal called "MyChart".  Sign up information is provided on this After Visit Summary.  MyChart is used to connect with patients for Virtual Visits (Telemedicine).  Patients are able to view lab/test results, encounter notes, upcoming appointments, etc.  Non-urgent messages can be sent to your provider as well.   To learn more about what you can do with MyChart, go to [email or web address].    Your next appointment:   1 year(s)  The format for your next appointment:   In Person  Provider:   Mertie Moores, MD    Other Instructions

## 2021-08-04 ENCOUNTER — Other Ambulatory Visit (HOSPITAL_COMMUNITY): Payer: Self-pay

## 2021-08-15 ENCOUNTER — Other Ambulatory Visit (HOSPITAL_COMMUNITY): Payer: Self-pay

## 2021-08-16 ENCOUNTER — Other Ambulatory Visit (HOSPITAL_COMMUNITY): Payer: Self-pay

## 2021-08-24 ENCOUNTER — Other Ambulatory Visit: Payer: Self-pay

## 2021-08-24 ENCOUNTER — Inpatient Hospital Stay: Payer: Medicare Other | Attending: Oncology | Admitting: Adult Health

## 2021-08-24 ENCOUNTER — Encounter: Payer: Self-pay | Admitting: Adult Health

## 2021-08-24 VITALS — BP 137/63 | HR 58 | Temp 97.5°F | Resp 18 | Ht 69.0 in | Wt 229.0 lb

## 2021-08-24 DIAGNOSIS — R918 Other nonspecific abnormal finding of lung field: Secondary | ICD-10-CM | POA: Diagnosis not present

## 2021-08-24 DIAGNOSIS — C7951 Secondary malignant neoplasm of bone: Secondary | ICD-10-CM | POA: Diagnosis present

## 2021-08-24 DIAGNOSIS — Z79899 Other long term (current) drug therapy: Secondary | ICD-10-CM | POA: Insufficient documentation

## 2021-08-24 DIAGNOSIS — I700 Atherosclerosis of aorta: Secondary | ICD-10-CM | POA: Diagnosis not present

## 2021-08-24 DIAGNOSIS — C61 Malignant neoplasm of prostate: Secondary | ICD-10-CM | POA: Insufficient documentation

## 2021-08-24 NOTE — Progress Notes (Signed)
SURVIVORSHIP VISIT:    BRIEF ONCOLOGIC HISTORY:  Oncology History  Prostate cancer ([place])  11/24/2020 Cancer Staging   Staging form: Prostate, AJCC 8th Edition - Clinical stage from 11/24/2020: Stage IVB (cT2b, cN0, cM1b, PSA: 11.6, Grade Group: 5) - Signed by Freeman Caldron, PA-C on 02/01/2021 Histopathologic type: Adenocarcinoma, NOS Stage prefix: Initial diagnosis Prostate specific antigen (PSA) range: 10 to 19 Gleason primary pattern: 5 Gleason secondary pattern: 4 Gleason score: 9 Histologic grading system: 5 grade system Number of biopsy cores examined: 13 Number of biopsy cores positive: 13 Location of positive needle core biopsies: Both sides    01/26/2021 Initial Diagnosis   Prostate cancer ([place])   02/25/2021 PET scan   IMPRESSION: 1. Left sacral sclerotic lesion with mild tracer uptake, suspicious for isolated osseous metastasis. 2. Nonspecific left greater than right prostatic tracer uptake. 3. No findings of tracer avid soft tissue metastasis. 4. Small pulmonary nodules, nonspecific and below PET resolution. 5. Incidental findings, including: Coronary artery atherosclerosis. Aortic Atherosclerosis (ICD10-I70.0). Right nephrolithiasis. Hiatal hernia.   03/10/2021 - 04/08/2021 Radiation Therapy   The prostate, seminal vesicles, pelvic lymph nodes and pelvic bone metastasis were initially treated to 44 Gy in 20 fractions of 2.2 Gy, using a simultaneous integrated boost (SIB) to the prostate and pelvic bone oligometastasis to a total dose of 60 Gy with 20 fractions of 3.0 Gy.   04/16/2021 Miscellaneous   Eligard injection every 3 months; Zytiga 1000mg  daily with prednisone 5mg  daily     INTERVAL HISTORY:   Loletta Parish, Brooke Bonito. is here today for his survivorship visit regarding his prostate cancer.  He tells me that he is doing pretty well.  He says that the only thing he worries about his what his scans will show when they are completed next year.  He continues on [place] and  Eligard with good tolerance.  He denies any significant urinary issues.  He notes that he lives in [place] with his daughter and his granddaughter is going attending [employer]  REVIEW OF SYSTEMS:  Review of Systems  Constitutional:  Negative for appetite change, chills, fatigue, fever and unexpected weight change.  HENT:   Negative for hearing loss, lump/mass and trouble swallowing.   Eyes:  Negative for eye problems and icterus.  Respiratory:  Negative for chest tightness, cough and shortness of breath.   Cardiovascular:  Negative for chest pain, leg swelling and palpitations.  Gastrointestinal:  Negative for abdominal distention, abdominal pain, constipation, diarrhea, nausea and vomiting.  Endocrine: Negative for hot flashes.  Genitourinary:  Negative for difficulty urinating.   Musculoskeletal:  Negative for arthralgias.  Skin:  Negative for itching and rash.  Neurological:  Negative for dizziness, extremity weakness, headaches and numbness.  Hematological:  Negative for adenopathy. Does not bruise/bleed easily.  Psychiatric/Behavioral:  Negative for depression. The patient is not nervous/anxious.   Breast: Denies any new nodularity, masses, tenderness, nipple changes, or nipple discharge.         PAST MEDICAL/SURGICAL HISTORY:  Past Medical History:  Diagnosis Date   High cholesterol    Hypertension    Prostate cancer [facility])    Past Surgical History:  Procedure Laterality Date   ABLATION OF DYSRHYTHMIC FOCUS     torn meniscus Right    torn meniscus Left      ALLERGIES:  No Known Allergies   CURRENT MEDICATIONS:  Outpatient Encounter Medications as of 08/24/2021  Medication Sig   abiraterone acetate (ZYTIGA) 250 MG tablet Take 4 tablets (1,000 mg total) by  mouth daily. Take on an empty stomach 1 hour before or 2 hours after a meal   ammonium lactate (LAC-HYDRIN) 12 % lotion Apply topically.   aspirin 81 MG EC tablet Take by mouth.   atenolol (TENORMIN) 50  MG tablet Take 50 mg by mouth 2 (two) times daily.   Coenzyme Q10 200 MG capsule Take by mouth.   ketoconazole (NIZORAL) 2 % shampoo Apply topically.   Leuprolide Acetate, 3 Month, (ELIGARD) 22.5 MG injection Inject into the skin every 3 (three) months.   lisinopril-hydrochlorothiazide (ZESTORETIC) 20-12.5 MG tablet Take 1 tablet by mouth daily.   mometasone (ELOCON) 0.1 % lotion Apply 0.1 application topically 1 day or 1 dose.   Multiple Vitamin (MULTIVITAMINS PO) Take by mouth.   pravastatin (PRAVACHOL) 40 MG tablet Take 40 mg by mouth daily.   predniSONE (DELTASONE) 5 MG tablet Take 1 tablet (5 mg total) by mouth daily with breakfast.   rivaroxaban (XARELTO) 20 MG TABS tablet TAKE 1 TABLET DAILY WITH DINNER   No facility-administered encounter medications on file as of 08/24/2021.     ONCOLOGIC FAMILY HISTORY:  Family History  Family history unknown: Yes     GENETIC COUNSELING/TESTING: Indicated, however the patient declined today.  SOCIAL HISTORY:  Social History   Socioeconomic History   Marital status: Widowed    Spouse name: Not on file   Number of children: 2   Years of education: Not on file   Highest education level: Not on file  Occupational History   Not on file  Tobacco Use   Smoking status: Never    Passive exposure: Past   Smokeless tobacco: Never  Vaping Use   Vaping Use: Every day  Substance and Sexual Activity   Alcohol use: Not Currently   Drug use: Never   Sexual activity: Not Currently  Other Topics Concern   Not on file  Social History Narrative   Not on file   Social Determinants of Health   Financial Resource Strain: Low Risk    Difficulty of Paying Living Expenses: Not hard at all  Food Insecurity: No Food Insecurity   Worried About Charity fundraiser in the Last Year: Never true   [place] in the Last Year: Never true  Transportation Needs: No Transportation Needs   Lack of Transportation (Medical): No   Lack of  Transportation (Non-Medical): No  Physical Activity: Sufficiently Active   Days of Exercise per Week: 5 days   Minutes of Exercise per Session: 30 min  Stress: No Stress Concern Present   Feeling of Stress : Not at all  Social Connections: Moderately Isolated   Frequency of Communication with Friends and Family: Three times a week   Frequency of Social Gatherings with Friends and Family: Three times a week   Attends Religious Services: Never   Active Member of Clubs or Organizations: Yes   Attends Archivist Meetings: 1 to 4 times per year   Marital Status: Widowed  Human resources officer Violence: Not At Risk   Fear of Current or Ex-Partner: No   Emotionally Abused: No   Physically Abused: No   Sexually Abused: No     OBSERVATIONS/OBJECTIVE:  Patient appears well is in no apparent distress, breathing is nonlabored skin visualized without rash or bleeding otherwise encounter was discussion. BP 137/63 (BP Location: Left Arm, Patient Position: Sitting)    Pulse (!) 58    Temp (!) 97.5 F (36.4 C) (Tympanic)    Resp 18  Ht 5\' 9"  (1.753 m)    Wt 229 lb (103.9 kg)    SpO2 100%    BMI 33.82 kg/m    LABORATORY DATA:  None for this visit.  DIAGNOSTIC IMAGING:  None for this visit.      ASSESSMENT AND PLAN:  Mr.. Check is a pleasant 75 y.o. male with stage IV prostate cancer who is here today for discussion of his survivorship and information and support regarding such.  1.  Stage IV prostate cancer.  We discussed his cancer diagnosis risk groups and what that means moving forward.  He continues to follow-up with Dr. Alen Blew and we will see him again in February, 2022.  He understands his treatment plan with Eligard and Zytiga.  He has remained compliant with his treatment.  2.  Genetic testing qualification: I reviewed with her that he is qualified to undergo genetic testing considering he had metastatic prostate cancer.  He is not interested in undergoing testing and  understands the benefits and risks of doing such.  3. Cancer screening:  Due to Mr. Simons's history and age, she should receive screening for skin cancers, colon cancer.  The information and recommendations are listed on the patient's comprehensive care plan/treatment summary and were reviewed in detail with the patient.    4. Health maintenance and wellness promotion: Mr. Harriger was encouraged to consume 5-7 servings of fruits and vegetables per day. We reviewed the "Nutrition Rainbow" handout.  he was also encouraged to engage in moderate to vigorous exercise for 30 minutes per day most days of the week. We discussed the [employer] fitness program, which is designed for cancer survivors to help them become more physically fit after cancer treatments.  he was instructed to limit his alcohol consumption and continue to abstain from tobacco use.     5. Support services/counseling: It is not uncommon for this period of the patient's cancer care trajectory to be one of many emotions and stressors. he was given information regarding our available services and encouraged to contact me with any questions or for help enrolling in any of our support group/programs.    Follow up instructions:    He will follow-up with Dr. Alen Blew in February, 2022.   The patient was provided an opportunity to ask questions and all were answered. The patient agreed with the plan and demonstrated an understanding of the instructions.   Total encounter time: 45 minutes in face-to-face visit time, chart review, lab review, care coordination, SCP preparation, and documentation of the encounter.  Wilber Bihari, NP 08/24/21 12:20 PM Medical Oncology and Hematology [facility] [address], [place] [address] Tel. [phone]    Fax. [phone]  *Total Encounter Time as defined by the Centers for Medicare and Medicaid Services includes, in addition to the face-to-face time of a patient visit  (documented in the note above) non-face-to-face time: obtaining and reviewing outside history, ordering and reviewing medications, tests or procedures, care coordination (communications with other health care professionals or caregivers) and documentation in the medical record.

## 2021-08-30 ENCOUNTER — Other Ambulatory Visit (HOSPITAL_COMMUNITY): Payer: Self-pay

## 2021-08-31 ENCOUNTER — Other Ambulatory Visit (HOSPITAL_COMMUNITY): Payer: Self-pay

## 2021-09-01 ENCOUNTER — Other Ambulatory Visit (HOSPITAL_COMMUNITY): Payer: Self-pay

## 2021-09-10 ENCOUNTER — Other Ambulatory Visit: Payer: Self-pay | Admitting: Oncology

## 2021-09-10 ENCOUNTER — Other Ambulatory Visit (HOSPITAL_COMMUNITY): Payer: Self-pay

## 2021-09-10 DIAGNOSIS — C7951 Secondary malignant neoplasm of bone: Secondary | ICD-10-CM

## 2021-09-10 MED ORDER — ABIRATERONE ACETATE 250 MG PO TABS
1000.0000 mg | ORAL_TABLET | Freq: Every day | ORAL | 0 refills | Status: DC
  Filled 2021-09-10: qty 120, 30d supply, fill #0

## 2021-09-27 ENCOUNTER — Other Ambulatory Visit: Payer: Self-pay | Admitting: Oncology

## 2021-09-27 ENCOUNTER — Other Ambulatory Visit (HOSPITAL_COMMUNITY): Payer: Self-pay

## 2021-09-27 DIAGNOSIS — C61 Malignant neoplasm of prostate: Secondary | ICD-10-CM

## 2021-09-28 ENCOUNTER — Encounter: Payer: Self-pay | Admitting: Oncology

## 2021-09-28 ENCOUNTER — Other Ambulatory Visit (HOSPITAL_COMMUNITY): Payer: Self-pay

## 2021-09-28 MED ORDER — ABIRATERONE ACETATE 250 MG PO TABS
1000.0000 mg | ORAL_TABLET | Freq: Every day | ORAL | 0 refills | Status: DC
  Filled 2021-09-28: qty 120, 30d supply, fill #0

## 2021-10-07 ENCOUNTER — Other Ambulatory Visit (HOSPITAL_COMMUNITY): Payer: Self-pay

## 2021-10-22 ENCOUNTER — Other Ambulatory Visit: Payer: Self-pay

## 2021-10-22 ENCOUNTER — Other Ambulatory Visit (HOSPITAL_COMMUNITY): Payer: Self-pay

## 2021-10-22 ENCOUNTER — Inpatient Hospital Stay: Payer: Medicare Other | Admitting: Oncology

## 2021-10-22 ENCOUNTER — Inpatient Hospital Stay: Payer: Medicare Other | Attending: Oncology

## 2021-10-22 ENCOUNTER — Inpatient Hospital Stay: Payer: Medicare Other

## 2021-10-22 VITALS — BP 134/73 | HR 62 | Temp 97.9°F | Resp 18 | Wt 228.2 lb

## 2021-10-22 DIAGNOSIS — Z5111 Encounter for antineoplastic chemotherapy: Secondary | ICD-10-CM | POA: Insufficient documentation

## 2021-10-22 DIAGNOSIS — Z7901 Long term (current) use of anticoagulants: Secondary | ICD-10-CM | POA: Diagnosis not present

## 2021-10-22 DIAGNOSIS — C61 Malignant neoplasm of prostate: Secondary | ICD-10-CM

## 2021-10-22 DIAGNOSIS — Z79899 Other long term (current) drug therapy: Secondary | ICD-10-CM | POA: Diagnosis not present

## 2021-10-22 DIAGNOSIS — C7951 Secondary malignant neoplasm of bone: Secondary | ICD-10-CM

## 2021-10-22 LAB — CMP (CANCER CENTER ONLY)
ALT: 18 U/L (ref 0–44)
AST: 21 U/L (ref 15–41)
Albumin: 4.4 g/dL (ref 3.5–5.0)
Alkaline Phosphatase: 45 U/L (ref 38–126)
Anion gap: 7 (ref 5–15)
BUN: 29 mg/dL — ABNORMAL HIGH (ref 8–23)
CO2: 29 mmol/L (ref 22–32)
Calcium: 9.8 mg/dL (ref 8.9–10.3)
Chloride: 103 mmol/L (ref 98–111)
Creatinine: 1.18 mg/dL (ref 0.61–1.24)
GFR, Estimated: >60 mL/min (ref >60)
Glucose, Bld: 98 mg/dL (ref 70–99)
Potassium: 4.1 mmol/L (ref 3.5–5.1)
Sodium: 139 mmol/L (ref 135–145)
Total Bilirubin: 0.8 mg/dL (ref 0.3–1.2)
Total Protein: 7 g/dL (ref 6.5–8.1)

## 2021-10-22 LAB — CBC WITH DIFFERENTIAL (CANCER CENTER ONLY)
Abs Immature Granulocytes: 0.01 10*3/uL (ref 0.00–0.07)
Basophils Absolute: 0.1 10*3/uL (ref 0.0–0.1)
Basophils Relative: 1 %
Eosinophils Absolute: 0.2 10*3/uL (ref 0.0–0.5)
Eosinophils Relative: 2 %
HCT: 35 % — ABNORMAL LOW (ref 39.0–52.0)
Hemoglobin: 12.5 g/dL — ABNORMAL LOW (ref 13.0–17.0)
Immature Granulocytes: 0 %
Lymphocytes Relative: 14 %
Lymphs Abs: 1 10*3/uL (ref 0.7–4.0)
MCH: 33.7 pg (ref 26.0–34.0)
MCHC: 35.7 g/dL (ref 30.0–36.0)
MCV: 94.3 fL (ref 80.0–100.0)
Monocytes Absolute: 0.8 10*3/uL (ref 0.1–1.0)
Monocytes Relative: 11 %
Neutro Abs: 4.8 10*3/uL (ref 1.7–7.7)
Neutrophils Relative %: 72 %
Platelet Count: 235 10*3/uL (ref 150–400)
RBC: 3.71 MIL/uL — ABNORMAL LOW (ref 4.22–5.81)
RDW: 13.5 % (ref 11.5–15.5)
WBC Count: 6.8 10*3/uL (ref 4.0–10.5)
nRBC: 0 % (ref 0.0–0.2)

## 2021-10-22 MED ORDER — LEUPROLIDE ACETATE (3 MONTH) 22.5 MG ~~LOC~~ KIT
22.5000 mg | PACK | Freq: Once | SUBCUTANEOUS | Status: AC
  Administered 2021-10-22: 22.5 mg via SUBCUTANEOUS
  Filled 2021-10-22: qty 22.5

## 2021-10-22 NOTE — Progress Notes (Signed)
Hematology and Oncology Follow Up Visit  Raymond Huang [id number] [birth year] 76 y.o. 10/22/2021 11:26 AM Pcp, Hoyle Sauer, MD   Principle Diagnosis: 76 year old man with advanced prostate cancer to the bone diagnosed April 2022.  He presented with castration-sensitive, Gleason score 5+4 = 9, PSA 4.5 at that time.   Prior Therapy:  He is status post radiation therapy under the care of Dr. Tammi Klippel.  He completed a total of 60 Gray to the prostate as well as to the oligometastatic left sacral lesion.  Therapy completed in July 2022.  Current therapy:   Eligard 22.5 mg every 3 months.  He will receive injection today.  Zytiga 1000 mg daily with prednisone 5 mg daily.  Interim History: Raymond Huang returns today for a follow-up evaluation.  Since the last visit, he reports no major changes in his health.  He continues to tolerate Zytiga with few complaints.  He has reported some fatigue and tiredness occasionally although still able to be active.  He continues to exercise in the gym and swim periodically.  He denies any bone pain or pathological fractures.  He denies any other complications related to Zytiga.     Medications: Reviewed without changes. Current Outpatient Medications  Medication Sig Dispense Refill   abiraterone acetate (ZYTIGA) 250 MG tablet Take 4 tablets (1,000 mg total) by mouth daily. Take on an empty stomach 1 hour before or 2 hours after a meal 120 tablet 0   ammonium lactate (LAC-HYDRIN) 12 % lotion Apply topically.     aspirin 81 MG EC tablet Take by mouth.     atenolol (TENORMIN) 50 MG tablet Take 50 mg by mouth 2 (two) times daily.     Coenzyme Q10 200 MG capsule Take by mouth.     ketoconazole (NIZORAL) 2 % shampoo Apply topically.     Leuprolide Acetate, 3 Month, (ELIGARD) 22.5 MG injection Inject into the skin every 3 (three) months.     lisinopril-hydrochlorothiazide (ZESTORETIC) 20-12.5 MG tablet Take 1 tablet by mouth daily.     mometasone (ELOCON)  0.1 % lotion Apply 0.1 application topically 1 day or 1 dose.     Multiple Vitamin (MULTIVITAMINS PO) Take by mouth.     pravastatin (PRAVACHOL) 40 MG tablet Take 40 mg by mouth daily.     predniSONE (DELTASONE) 5 MG tablet Take 1 tablet (5 mg total) by mouth daily with breakfast. 90 tablet 3   rivaroxaban (XARELTO) 20 MG TABS tablet TAKE 1 TABLET DAILY WITH DINNER     No current facility-administered medications for this visit.     Allergies: No Known Allergies    Physical Exam: Blood pressure 134/73, pulse 62, temperature 97.9 F (36.6 C), resp. rate 18, weight 228 lb 3.2 oz (103.5 kg), SpO2 98 %.   ECOG: 1     General appearance: Comfortable appearing without any discomfort Head: Normocephalic without any trauma Oropharynx: Mucous membranes are moist and pink without any thrush or ulcers. Eyes: Pupils are equal and round reactive to light. Lymph nodes: No cervical, supraclavicular, inguinal or axillary lymphadenopathy.   Heart:regular rate and rhythm.  S1 and S2 without leg edema. Lung: Clear without any rhonchi or wheezes.  No dullness to percussion. Abdomin: Soft, nontender, nondistended with good bowel sounds.  No hepatosplenomegaly. Musculoskeletal: No joint deformity or effusion.  Full range of motion noted. Neurological: No deficits noted on motor, sensory and deep tendon reflex exam. Skin: No petechial rash or dryness.  Appeared moist.  Lab Results: Lab Results  Component Value Date   WBC 6.8 10/22/2021   HGB 12.5 (L) 10/22/2021   HCT 35.0 (L) 10/22/2021   MCV 94.3 10/22/2021   PLT 235 10/22/2021     Chemistry      Component Value Date/Time   NA 139 07/15/2021 1051   K 4.1 07/15/2021 1051   CL 104 07/15/2021 1051   CO2 26 07/15/2021 1051   BUN 23 07/15/2021 1051   CREATININE 1.03 07/15/2021 1051      Component Value Date/Time   CALCIUM 9.5 07/15/2021 1051   ALKPHOS 47 07/15/2021 1051   AST 25 07/15/2021 1051   ALT 22 07/15/2021 1051    BILITOT 1.1 07/15/2021 1051        Latest Reference Range & Units 04/16/21 09:28 07/15/21 10:51  Prostate Specific Ag, Serum 0.0 - 4.0 ng/mL <0.1 <0.1     Impression and Plan:  76 year old man with:   1.  Advanced prostate cancer with disease to the bone diagnosed in March 2022.  He has castration-sensitive disease at this time.   The natural course of this disease was reviewed at this time and treatment choices were discussed.  He is currently on Zytiga with PSA remains undetectable.  Risks and benefits of continuing this treatment were reviewed at this time.  Therapy discontinuation could be considered if he has no metastatic disease 2 years after the completion of therapy.  After discussion today, the plan is to continue with the same dose and schedule and we will update his staging scan before the next visit.  Therapy discontinuation could be considered after that.   2.  Androgen depravation: This will be repeated today and in 3 months.  Complication clued weight gain, hot flashes among others were reviewed.   3.  Bone directed therapy: He is to continue calcium and vitamin D supplements at this time.  Delton See has been deferred due to dental issues.   4.  Hypertension: No issues reported with his blood pressure at this time.     5.  Goals of care and prognosis: His disease is incurable although aggressive measures are warranted at this time.   6.  Follow-up: In 3 months for a follow-up visit.   30  minutes were spent on this encounter.  The time was dedicated to reviewing laboratory data, addressing complications related to cancer and cancer therapy and future plan of care review.    Zola Button, MD 2/10/202311:26 AM

## 2021-10-23 ENCOUNTER — Encounter: Payer: Self-pay | Admitting: Oncology

## 2021-10-23 LAB — PROSTATE-SPECIFIC AG, SERUM (LABCORP): Prostate Specific Ag, Serum: <0.1 ng/mL (ref 0.0–4.0)

## 2021-10-25 ENCOUNTER — Telehealth: Payer: Self-pay | Admitting: *Deleted

## 2021-10-25 NOTE — Telephone Encounter (Signed)
Per Dr.Shadad, called pt with message below. VM was left, advised to call office with any concerns.

## 2021-10-25 NOTE — Telephone Encounter (Signed)
-----   Message from Raymond Portela, MD sent at 10/25/2021  9:36 AM EST ----- Please let him know his PSA is down

## 2021-10-26 ENCOUNTER — Other Ambulatory Visit: Payer: Self-pay | Admitting: Oncology

## 2021-10-26 ENCOUNTER — Other Ambulatory Visit (HOSPITAL_COMMUNITY): Payer: Self-pay

## 2021-10-26 DIAGNOSIS — C61 Malignant neoplasm of prostate: Secondary | ICD-10-CM

## 2021-10-26 MED ORDER — ABIRATERONE ACETATE 250 MG PO TABS
1000.0000 mg | ORAL_TABLET | Freq: Every day | ORAL | 0 refills | Status: DC
  Filled 2021-11-03: qty 120, 30d supply, fill #0

## 2021-11-01 ENCOUNTER — Other Ambulatory Visit (HOSPITAL_COMMUNITY): Payer: Self-pay

## 2021-11-03 ENCOUNTER — Other Ambulatory Visit (HOSPITAL_COMMUNITY): Payer: Self-pay

## 2021-11-04 ENCOUNTER — Other Ambulatory Visit (HOSPITAL_COMMUNITY): Payer: Self-pay

## 2021-11-04 ENCOUNTER — Encounter: Payer: Self-pay | Admitting: Oncology

## 2021-11-10 ENCOUNTER — Other Ambulatory Visit (HOSPITAL_COMMUNITY): Payer: Self-pay

## 2021-11-26 ENCOUNTER — Other Ambulatory Visit: Payer: Self-pay | Admitting: Oncology

## 2021-11-26 ENCOUNTER — Other Ambulatory Visit (HOSPITAL_COMMUNITY): Payer: Self-pay

## 2021-11-26 DIAGNOSIS — C61 Malignant neoplasm of prostate: Secondary | ICD-10-CM

## 2021-11-26 MED ORDER — ABIRATERONE ACETATE 250 MG PO TABS
1000.0000 mg | ORAL_TABLET | Freq: Every day | ORAL | 0 refills | Status: DC
  Filled 2021-11-26: qty 120, 30d supply, fill #0

## 2021-12-02 ENCOUNTER — Other Ambulatory Visit (HOSPITAL_COMMUNITY): Payer: Self-pay

## 2021-12-17 ENCOUNTER — Other Ambulatory Visit (HOSPITAL_COMMUNITY): Payer: Self-pay

## 2021-12-20 ENCOUNTER — Other Ambulatory Visit (HOSPITAL_COMMUNITY): Payer: Self-pay

## 2021-12-23 ENCOUNTER — Other Ambulatory Visit: Payer: Self-pay | Admitting: Oncology

## 2021-12-23 ENCOUNTER — Other Ambulatory Visit (HOSPITAL_COMMUNITY): Payer: Self-pay

## 2021-12-23 DIAGNOSIS — C7951 Secondary malignant neoplasm of bone: Secondary | ICD-10-CM

## 2021-12-23 MED ORDER — ABIRATERONE ACETATE 250 MG PO TABS
1000.0000 mg | ORAL_TABLET | Freq: Every day | ORAL | 0 refills | Status: DC
  Filled 2021-12-23: qty 120, 30d supply, fill #0

## 2021-12-29 ENCOUNTER — Telehealth: Payer: Self-pay

## 2021-12-29 ENCOUNTER — Encounter: Payer: Self-pay | Admitting: Oncology

## 2021-12-29 ENCOUNTER — Other Ambulatory Visit (HOSPITAL_COMMUNITY): Payer: Self-pay

## 2021-12-29 NOTE — Telephone Encounter (Signed)
Oral Oncology Patient Advocate Encounter ?  ?Was successful in securing patient a $5500 grant from [employer] ([employer]) to provide copayment coverage for Zytiga.  This will keep the out of pocket expense at $0.   ?  ?I have spoken with the patient.   ? ?The billing information is as follows and has been shared with [facility] ? ? ?RxBin: [id number] ?PCN:  [id number] ?Member ID: [id number] ?Group ID: [id number] ?Dates of Eligibility: 07/02/21 through 12/29/22 ? ?Wynn Maudlin CPHT ?Specialty Pharmacy Patient Advocate ?[place] ?Phone [phone] ?Fax [phone] ?12/29/2021 1:50 PM ? ?

## 2022-01-05 ENCOUNTER — Telehealth: Payer: Self-pay | Admitting: Oncology

## 2022-01-05 NOTE — Telephone Encounter (Signed)
Called patient regarding upcoming appointment, patient is notified. °

## 2022-01-19 ENCOUNTER — Encounter (HOSPITAL_COMMUNITY)
Admission: RE | Admit: 2022-01-19 | Discharge: 2022-01-19 | Disposition: A | Discharge status: Home / Self Care | Payer: Medicare Other | Source: Ambulatory Visit | Attending: Oncology | Admitting: Oncology

## 2022-01-19 DIAGNOSIS — C7951 Secondary malignant neoplasm of bone: Secondary | ICD-10-CM | POA: Insufficient documentation

## 2022-01-19 DIAGNOSIS — C61 Malignant neoplasm of prostate: Secondary | ICD-10-CM | POA: Diagnosis present

## 2022-01-19 MED ORDER — PIFLIFOLASTAT F 18 (PYLARIFY) INJECTION
9.0000 | Freq: Once | INTRAVENOUS | Status: AC
  Administered 2022-01-19: 9 via INTRAVENOUS

## 2022-01-20 ENCOUNTER — Inpatient Hospital Stay: Payer: Medicare Other

## 2022-01-20 ENCOUNTER — Other Ambulatory Visit: Payer: Self-pay

## 2022-01-20 ENCOUNTER — Inpatient Hospital Stay: Payer: Medicare Other | Admitting: Oncology

## 2022-01-20 ENCOUNTER — Inpatient Hospital Stay: Payer: Medicare Other | Attending: Oncology

## 2022-01-20 VITALS — BP 127/74 | HR 62 | Temp 97.6°F | Resp 15 | Ht 69.0 in | Wt 224.0 lb

## 2022-01-20 DIAGNOSIS — C7951 Secondary malignant neoplasm of bone: Secondary | ICD-10-CM | POA: Insufficient documentation

## 2022-01-20 DIAGNOSIS — C61 Malignant neoplasm of prostate: Secondary | ICD-10-CM

## 2022-01-20 DIAGNOSIS — Z5111 Encounter for antineoplastic chemotherapy: Secondary | ICD-10-CM | POA: Insufficient documentation

## 2022-01-20 LAB — CBC WITH DIFFERENTIAL (CANCER CENTER ONLY)
Abs Immature Granulocytes: 0.02 10*3/uL (ref 0.00–0.07)
Basophils Absolute: 0.1 10*3/uL (ref 0.0–0.1)
Basophils Relative: 1 %
Eosinophils Absolute: 0.1 10*3/uL (ref 0.0–0.5)
Eosinophils Relative: 1 %
HCT: 37.3 % — ABNORMAL LOW (ref 39.0–52.0)
Hemoglobin: 13.4 g/dL (ref 13.0–17.0)
Immature Granulocytes: 0 %
Lymphocytes Relative: 7 %
Lymphs Abs: 0.8 10*3/uL (ref 0.7–4.0)
MCH: 33.1 pg (ref 26.0–34.0)
MCHC: 35.9 g/dL (ref 30.0–36.0)
MCV: 92.1 fL (ref 80.0–100.0)
Monocytes Absolute: 0.6 10*3/uL (ref 0.1–1.0)
Monocytes Relative: 6 %
Neutro Abs: 8.7 10*3/uL — ABNORMAL HIGH (ref 1.7–7.7)
Neutrophils Relative %: 85 %
Platelet Count: 253 10*3/uL (ref 150–400)
RBC: 4.05 MIL/uL — ABNORMAL LOW (ref 4.22–5.81)
RDW: 12.5 % (ref 11.5–15.5)
WBC Count: 10.2 10*3/uL (ref 4.0–10.5)
nRBC: 0 % (ref 0.0–0.2)

## 2022-01-20 LAB — CMP (CANCER CENTER ONLY)
ALT: 14 U/L (ref 0–44)
AST: 17 U/L (ref 15–41)
Albumin: 4.3 g/dL (ref 3.5–5.0)
Alkaline Phosphatase: 50 U/L (ref 38–126)
Anion gap: 7 (ref 5–15)
BUN: 26 mg/dL — ABNORMAL HIGH (ref 8–23)
CO2: 28 mmol/L (ref 22–32)
Calcium: 9.8 mg/dL (ref 8.9–10.3)
Chloride: 102 mmol/L (ref 98–111)
Creatinine: 1.14 mg/dL (ref 0.61–1.24)
GFR, Estimated: >60 mL/min (ref >60)
Glucose, Bld: 117 mg/dL — ABNORMAL HIGH (ref 70–99)
Potassium: 4.1 mmol/L (ref 3.5–5.1)
Sodium: 137 mmol/L (ref 135–145)
Total Bilirubin: 1 mg/dL (ref 0.3–1.2)
Total Protein: 7.2 g/dL (ref 6.5–8.1)

## 2022-01-20 MED ORDER — LEUPROLIDE ACETATE (3 MONTH) 22.5 MG ~~LOC~~ KIT
22.5000 mg | PACK | Freq: Once | SUBCUTANEOUS | Status: AC
  Administered 2022-01-20: 22.5 mg via SUBCUTANEOUS
  Filled 2022-01-20: qty 22.5

## 2022-01-20 NOTE — Progress Notes (Signed)
Hematology and Oncology Follow Up Visit ? ?Raymond Huang. ?[id number] ?09/19/1945 76 y.o. ?01/20/2022 10:06 AM ?Raymond Huang, Mathis Dad, MD  ? 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  He was found to have Gleason score 5+4 = 9, PSA 4.5 ? ? ?Prior Therapy: ? ?He is status post radiation therapy under the care of Dr. Tammi Klippel.  He completed a total of 60 Gray to the prostate as well as to the oligometastatic left sacral lesion.  Therapy completed in July 2022. ? ?Current therapy:  ? ?Eligard 22.5 mg every 3 months.  This will be repeated today. ? ?Zytiga 1000 mg daily with prednisone 5 mg daily.  This was started in April 2022. ? ?Interim History: Raymond Huang is here for a follow-up visit.  Since last visit, he reports a few complications related to Zytiga.  He has reported some fatigue and decline in his energy and occasional dyspnea.  He is still able to perform most activities of daily living and exercise regularly.  He is able to swim 1 complete lap without stopping but he definitely noticed decrease in his stamina. ? ? ? ? ?Medications: Updated on review. ?Current Outpatient Medications  ?Medication Sig Dispense Refill  ? abiraterone acetate (ZYTIGA) 250 MG tablet Take 4 tablets (1,000 mg total) by mouth daily. Take on an empty stomach 1 hour before or 2 hours after a meal 120 tablet 0  ? ammonium lactate (LAC-HYDRIN) 12 % lotion Apply topically.    ? aspirin 81 MG EC tablet Take by mouth.    ? atenolol (TENORMIN) 50 MG tablet Take 50 mg by mouth 2 (two) times daily.    ? Coenzyme Q10 200 MG capsule Take by mouth.    ? ketoconazole (NIZORAL) 2 % shampoo Apply topically.    ? Leuprolide Acetate, 3 Month, (ELIGARD) 22.5 MG injection Inject into the skin every 3 (three) months.    ? lisinopril-hydrochlorothiazide (ZESTORETIC) 20-12.5 MG tablet Take 1 tablet by mouth daily.    ? mometasone (ELOCON) 0.1 % lotion Apply 0.1  application topically 1 day or 1 dose.    ? Multiple Vitamin (MULTIVITAMINS PO) Take by mouth.    ? pravastatin (PRAVACHOL) 40 MG tablet Take 40 mg by mouth daily.    ? predniSONE (DELTASONE) 5 MG tablet Take 1 tablet (5 mg total) by mouth daily with breakfast. 90 tablet 3  ? rivaroxaban (XARELTO) 20 MG TABS tablet TAKE 1 TABLET DAILY WITH DINNER    ? ?No current facility-administered medications for this visit.  ? ? ? ?Allergies: No Known Allergies ? ? ? ?Physical Exam: ?Blood pressure 127/74, pulse 62, temperature 97.6 ?F (36.4 ?C), temperature source Temporal, resp. rate 15, height '5\' 9"'$  (1.753 m), weight 224 lb (101.6 kg), SpO2 99 %. ? ? ?ECOG: 1 ? ? ? ?General appearance: Alert, awake without any distress. ?Head: Atraumatic without abnormalities ?Oropharynx: Without any thrush or ulcers. ?Eyes: No scleral icterus. ?Lymph nodes: No lymphadenopathy noted in the cervical, supraclavicular, or axillary nodes ?Heart:regular rate and rhythm, without any murmurs or gallops.   ?Lung: Clear to auscultation without any rhonchi, wheezes or dullness to percussion. ?Abdomin: Soft, nontender without any shifting dullness or ascites. ?Musculoskeletal: No clubbing or cyanosis. ?Neurological: No motor or sensory deficits. ?Skin: No rashes or lesions. ? ? ? ? ? ? ?Lab Results: ?Lab Results  ?Component Value Date  ? WBC 10.2 01/20/2022  ? HGB 13.4 01/20/2022  ? HCT  37.3 (L) 01/20/2022  ? MCV 92.1 01/20/2022  ? PLT 253 01/20/2022  ? ?  Chemistry   ?   ?Component Value Date/Time  ? NA 139 10/22/2021 1117  ? K 4.1 10/22/2021 1117  ? CL 103 10/22/2021 1117  ? CO2 29 10/22/2021 1117  ? BUN 29 (H) 10/22/2021 1117  ? CREATININE 1.18 10/22/2021 1117  ?    ?Component Value Date/Time  ? CALCIUM 9.8 10/22/2021 1117  ? ALKPHOS 45 10/22/2021 1117  ? AST 21 10/22/2021 1117  ? ALT 18 10/22/2021 1117  ? BILITOT 0.8 10/22/2021 1117  ?  ? ? ? ? Latest Reference Range & Units 04/16/21 09:28 07/15/21 10:51 10/22/21 11:17  ?Prostate Specific Ag, Serum  0.0 - 4.0 ng/mL <0.1 <0.1 <0.1  ? ? ? ?Impression and Plan: ? ?76 year old man with: ?  ?1.  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   ?  ?His disease status was updated at this time and treatment choices were reviewed.  His PSA continues to be undetectable.  PSMA PET scan was obtained but results are currently pending.  Risks and benefits of continuing Zytiga versus dose reduction given his excessive fatigue were reviewed.  We have agreed on reducing the dose to 500 mg daily if his PET scan is within normal range. ?  ?2.  Androgen depravation: I recommended continuing this for the time being indefinitely.  Complication related to weight gain hot flashes were reviewed.  Stopping androgen deprivation in 2 years could be considered if he has a complete response to therapy. ?  ?3.  Bone directed therapy: I recommended calcium and vitamin D supplements to be continued. ?  ?4.  Hypertension: His blood pressure is within normal range. ?  ?  ?5.  Goals of care and prognosis: Therapy remains palliative although aggressive measures are warranted given his excellent response to therapy. ?  ?6.  Follow-up: He will return in 3 months for a follow-up visit. ?  ?30  minutes were dedicated to this visit.  The time was spent on reviewing laboratory data, disease status update and outlining future plan of care review. ? ? ? ?Zola Button, MD ?5/11/202310:06 AM ? ?

## 2022-01-21 ENCOUNTER — Encounter: Payer: Self-pay | Admitting: Oncology

## 2022-01-21 ENCOUNTER — Telehealth: Payer: Self-pay | Admitting: *Deleted

## 2022-01-21 NOTE — Telephone Encounter (Signed)
-----   Message from Wyatt Portela, MD sent at 01/21/2022  3:07 PM EDT ----- ?Please let him know his PET is clear. He can decrease Zytiga like we discussed.  ?

## 2022-01-21 NOTE — Telephone Encounter (Signed)
Notified of message below

## 2022-01-22 LAB — PROSTATE-SPECIFIC AG, SERUM (LABCORP): Prostate Specific Ag, Serum: <0.1 ng/mL (ref 0.0–4.0)

## 2022-01-24 ENCOUNTER — Telehealth: Payer: Self-pay

## 2022-01-24 ENCOUNTER — Other Ambulatory Visit (HOSPITAL_COMMUNITY): Payer: Self-pay

## 2022-01-24 NOTE — Telephone Encounter (Signed)
Pt advised.

## 2022-01-24 NOTE — Telephone Encounter (Signed)
-----   Message from Wyatt Portela, MD sent at 01/24/2022  9:14 AM EDT ----- ?Please let him know his PSA is low ?

## 2022-02-09 ENCOUNTER — Other Ambulatory Visit: Payer: Self-pay | Admitting: Oncology

## 2022-02-09 ENCOUNTER — Other Ambulatory Visit (HOSPITAL_COMMUNITY): Payer: Self-pay

## 2022-02-09 DIAGNOSIS — C61 Malignant neoplasm of prostate: Secondary | ICD-10-CM

## 2022-02-09 MED ORDER — ABIRATERONE ACETATE 250 MG PO TABS
1000.0000 mg | ORAL_TABLET | Freq: Every day | ORAL | 0 refills | Status: DC
  Filled 2022-02-09: qty 120, 30d supply, fill #0

## 2022-02-09 MED ORDER — PREDNISONE 5 MG PO TABS
5.0000 mg | ORAL_TABLET | Freq: Every day | ORAL | 3 refills | Status: AC
  Filled 2022-02-09: qty 90, 90d supply, fill #0
  Filled 2022-05-14: qty 90, 90d supply, fill #1
  Filled 2022-07-24: qty 90, 90d supply, fill #2

## 2022-02-10 ENCOUNTER — Encounter: Payer: Self-pay | Admitting: Oncology

## 2022-02-10 ENCOUNTER — Other Ambulatory Visit (HOSPITAL_COMMUNITY): Payer: Self-pay

## 2022-02-10 ENCOUNTER — Telehealth: Payer: Self-pay | Admitting: Pharmacy Technician

## 2022-02-10 NOTE — Telephone Encounter (Signed)
Oral Oncology Patient Advocate Encounter  Was successful in securing patient a $8,000 grant from [employer] to provide copayment coverage for Zytiga.  This will keep the out of pocket expense at $0.     [employer] ID: [id number]  I have spoken with the patient.   The billing information is as follows and has been shared with [facility].    RxBin: [id number] PCN: [id number] Member ID: [id number] Group ID: [id number] Dates of Eligibility: 01/10/2022 through 01/11/2023  Fund:  [place], [place] Patient Advocate Specialist [place] Patient Advocate Team Direct Number: [phone]  Fax: [phone]

## 2022-02-28 ENCOUNTER — Other Ambulatory Visit (HOSPITAL_COMMUNITY): Payer: Self-pay

## 2022-03-22 ENCOUNTER — Other Ambulatory Visit: Payer: Self-pay | Admitting: Oncology

## 2022-03-22 ENCOUNTER — Other Ambulatory Visit (HOSPITAL_COMMUNITY): Payer: Self-pay

## 2022-03-22 DIAGNOSIS — C61 Malignant neoplasm of prostate: Secondary | ICD-10-CM

## 2022-03-22 MED ORDER — ABIRATERONE ACETATE 250 MG PO TABS
1000.0000 mg | ORAL_TABLET | Freq: Every day | ORAL | 0 refills | Status: DC
  Filled 2022-03-24: qty 120, 30d supply, fill #0

## 2022-03-24 ENCOUNTER — Other Ambulatory Visit (HOSPITAL_COMMUNITY): Payer: Self-pay

## 2022-03-31 ENCOUNTER — Other Ambulatory Visit (HOSPITAL_COMMUNITY): Payer: Self-pay

## 2022-04-15 ENCOUNTER — Other Ambulatory Visit (HOSPITAL_COMMUNITY): Payer: Self-pay

## 2022-04-21 ENCOUNTER — Inpatient Hospital Stay: Payer: Medicare Other

## 2022-04-21 ENCOUNTER — Inpatient Hospital Stay (HOSPITAL_BASED_OUTPATIENT_CLINIC_OR_DEPARTMENT_OTHER): Payer: Medicare Other | Admitting: Oncology

## 2022-04-21 ENCOUNTER — Inpatient Hospital Stay: Payer: Medicare Other | Attending: Oncology

## 2022-04-21 ENCOUNTER — Other Ambulatory Visit (HOSPITAL_COMMUNITY): Payer: Self-pay

## 2022-04-21 ENCOUNTER — Other Ambulatory Visit: Payer: Self-pay

## 2022-04-21 VITALS — BP 106/78 | HR 64 | Temp 97.7°F | Resp 20 | Wt 217.5 lb

## 2022-04-21 DIAGNOSIS — C61 Malignant neoplasm of prostate: Secondary | ICD-10-CM | POA: Diagnosis not present

## 2022-04-21 DIAGNOSIS — Z79899 Other long term (current) drug therapy: Secondary | ICD-10-CM | POA: Diagnosis not present

## 2022-04-21 DIAGNOSIS — C7951 Secondary malignant neoplasm of bone: Secondary | ICD-10-CM | POA: Insufficient documentation

## 2022-04-21 DIAGNOSIS — Z5111 Encounter for antineoplastic chemotherapy: Secondary | ICD-10-CM | POA: Diagnosis present

## 2022-04-21 LAB — CMP (CANCER CENTER ONLY)
ALT: 16 U/L (ref 0–44)
AST: 22 U/L (ref 15–41)
Albumin: 4.3 g/dL (ref 3.5–5.0)
Alkaline Phosphatase: 44 U/L (ref 38–126)
Anion gap: 7 (ref 5–15)
BUN: 21 mg/dL (ref 8–23)
CO2: 30 mmol/L (ref 22–32)
Calcium: 9.3 mg/dL (ref 8.9–10.3)
Chloride: 101 mmol/L (ref 98–111)
Creatinine: 1.25 mg/dL — ABNORMAL HIGH (ref 0.61–1.24)
GFR, Estimated: 59 mL/min — ABNORMAL LOW (ref >60)
Glucose, Bld: 126 mg/dL — ABNORMAL HIGH (ref 70–99)
Potassium: 4.1 mmol/L (ref 3.5–5.1)
Sodium: 138 mmol/L (ref 135–145)
Total Bilirubin: 1.1 mg/dL (ref 0.3–1.2)
Total Protein: 7.2 g/dL (ref 6.5–8.1)

## 2022-04-21 LAB — CBC WITH DIFFERENTIAL (CANCER CENTER ONLY)
Abs Immature Granulocytes: 0.01 10*3/uL (ref 0.00–0.07)
Basophils Absolute: 0 10*3/uL (ref 0.0–0.1)
Basophils Relative: 1 %
Eosinophils Absolute: 0.1 10*3/uL (ref 0.0–0.5)
Eosinophils Relative: 2 %
HCT: 36.2 % — ABNORMAL LOW (ref 39.0–52.0)
Hemoglobin: 12.7 g/dL — ABNORMAL LOW (ref 13.0–17.0)
Immature Granulocytes: 0 %
Lymphocytes Relative: 8 %
Lymphs Abs: 0.5 10*3/uL — ABNORMAL LOW (ref 0.7–4.0)
MCH: 33.2 pg (ref 26.0–34.0)
MCHC: 35.1 g/dL (ref 30.0–36.0)
MCV: 94.5 fL (ref 80.0–100.0)
Monocytes Absolute: 0.8 10*3/uL (ref 0.1–1.0)
Monocytes Relative: 12 %
Neutro Abs: 5.2 10*3/uL (ref 1.7–7.7)
Neutrophils Relative %: 77 %
Platelet Count: 176 10*3/uL (ref 150–400)
RBC: 3.83 MIL/uL — ABNORMAL LOW (ref 4.22–5.81)
RDW: 12.6 % (ref 11.5–15.5)
WBC Count: 6.7 10*3/uL (ref 4.0–10.5)
nRBC: 0 % (ref 0.0–0.2)

## 2022-04-21 MED ORDER — LEUPROLIDE ACETATE (3 MONTH) 22.5 MG ~~LOC~~ KIT
22.5000 mg | PACK | Freq: Once | SUBCUTANEOUS | Status: AC
  Administered 2022-04-21: 22.5 mg via SUBCUTANEOUS

## 2022-04-21 NOTE — Progress Notes (Signed)
Hematology and Oncology Follow Up Visit  Raymond Huang [id number] 1945-11-10 76 y.o. 04/21/2022 9:53 AM [name], Raymond Huang, DOLazoff, Raymond P, DO   Principle Diagnosis: 76 year old man with advanced prostate cancer with disease to the bone diagnosed in April 2022.  He has castration-sensitive, Gleason score 5+4 = 9, PSA 4.5   Prior Therapy:  He is status post radiation therapy under the care of Dr. Tammi Klippel.  He completed a total of 60 Gray to the prostate as well as to the oligometastatic left sacral lesion.  Therapy completed in July 2022.  Current therapy:   Eligard 22.5 mg every 3 months.  His next injection will be given today and repeated in 3 months.  Zytiga 1000 mg daily with prednisone 5 mg daily.  This was started in April 2022 however is reduced to 500 mg due to fatigue.  Interim History: Mr. Raymond Huang returns today for a follow-up visit.  Since last visit, he reports no major changes in his health.  He has decreased Zytiga to 500 mg daily without any major changes in his stamina or level of fatigue.  He is still active and continues to swim although limited in the number of laps each time.  Still able to play pickleball and remains reasonably active.  He denies any bone pain or pathological fractures.  He denies any hospitalizations or illnesses.     Medications: Reviewed without changes. Current Outpatient Medications  Medication Sig Dispense Refill   abiraterone acetate (ZYTIGA) 250 MG tablet Take 4 tablets (1,000 mg total) by mouth daily. Take on an empty stomach 1 hour before or 2 hours after a meal 120 tablet 0   ammonium lactate (LAC-HYDRIN) 12 % lotion Apply topically.     aspirin 81 MG EC tablet Take by mouth.     atenolol (TENORMIN) 50 MG tablet Take 50 mg by mouth 2 (two) times daily.     Coenzyme Q10 200 MG capsule Take by mouth.     ketoconazole (NIZORAL) 2 % shampoo Apply topically.     Leuprolide Acetate, 3 Month, (ELIGARD) 22.5 MG injection Inject into the skin  every 3 (three) months.     lisinopril-hydrochlorothiazide (ZESTORETIC) 20-12.5 MG tablet Take 1 tablet by mouth daily.     mometasone (ELOCON) 0.1 % lotion Apply 0.1 application topically 1 day or 1 dose.     Multiple Vitamin (MULTIVITAMINS PO) Take by mouth.     pravastatin (PRAVACHOL) 40 MG tablet Take 40 mg by mouth daily.     predniSONE (DELTASONE) 5 MG tablet Take 1 tablet (5 mg total) by mouth daily with breakfast. 90 tablet 3   rivaroxaban (XARELTO) 20 MG TABS tablet TAKE 1 TABLET DAILY WITH DINNER     No current facility-administered medications for this visit.     Allergies: No Known Allergies    Physical Exam:  Blood pressure 106/78, pulse 64, temperature 97.7 F (36.5 C), resp. rate 20, weight 217 lb 8 oz (98.7 kg), SpO2 98 %.   ECOG: 1   General appearance: Comfortable appearing without any discomfort Head: Normocephalic without any trauma Oropharynx: Mucous membranes are moist and pink without any thrush or ulcers. Eyes: Pupils are equal and round reactive to light. Lymph nodes: No cervical, supraclavicular, inguinal or axillary lymphadenopathy.   Heart:regular rate and rhythm.  S1 and S2 without leg edema. Lung: Clear without any rhonchi or wheezes.  No dullness to percussion. Abdomin: Soft, nontender, nondistended with good bowel sounds.  No hepatosplenomegaly. Musculoskeletal: No joint deformity  or effusion.  Full range of motion noted. Neurological: No deficits noted on motor, sensory and deep tendon reflex exam. Skin: No petechial rash or dryness.  Appeared moist.         Lab Results: Lab Results  Component Value Date   WBC 10.2 01/20/2022   HGB 13.4 01/20/2022   HCT 37.3 (L) 01/20/2022   MCV 92.1 01/20/2022   PLT 253 01/20/2022     Chemistry      Component Value Date/Time   NA 137 01/20/2022 0934   K 4.1 01/20/2022 0934   CL 102 01/20/2022 0934   CO2 28 01/20/2022 0934   BUN 26 (H) 01/20/2022 0934   CREATININE 1.14 01/20/2022 0934       Component Value Date/Time   CALCIUM 9.8 01/20/2022 0934   ALKPHOS 50 01/20/2022 0934   AST 17 01/20/2022 0934   ALT 14 01/20/2022 0934   BILITOT 1.0 01/20/2022 0934       Latest Reference Range & Units 07/15/21 10:51 10/22/21 11:17 01/20/22 09:34  Prostate Specific Ag, Serum 0.0 - 4.0 ng/mL <0.1 <0.1 <0.1    IMPRESSION: 1. No evidence of tracer avid metastatic disease. 2. Similar appearance of a left sacral sclerotic lesion without residual tracer affinity. Favor a treated osseous metastasis. No new osseous metastasis identified. 3. Incidental findings, including: Hiatal hernia. Right nephrolithiasis. Coronary artery atherosclerosis. Aortic Atherosclerosis (ICD10-I70.0). 4. Similar bilateral pulmonary nodules, favoring a benign etiology.     Impression and Plan:  76 year old man with:   1.  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  He presented with castration-sensitive in March 2022.     He continues to be on Zytiga in addition to androgen deprivation with excellent response to therapy.  PET scan obtained on Jan 19, 2022 showed no evidence of residual disease.  Risks and benefits of continuing Zytiga on the current dose were discussed as well as duration of therapy and the role of therapy de-escalation were reviewed.  Given his oligometastatic disease and his complete response that is reasonable to consider therapy de-escalation around March 2024 with discontinuation of both Zytiga and androgen deprivation.  For the time being we will continue the same dose and schedule of Zytiga 500 mg daily.   2.  Androgen depravation: He will receive Eligard today and repeated in 3 months.  Duration of therapy will be total of 2 years..   3.  Bone directed therapy: He is to continue calcium and vitamin D supplements.   4.  Hypertension: No issues reported with his blood pressure related to Zytiga.     5.  Goals of care and prognosis: His  disease is incurable although aggressive measures are warranted.   6.  Follow-up: In 3 months for repeat evaluation.   30  minutes were spent on this encounter.  The time was dedicated to reviewing laboratory data, disease status update and outlining future plan of care reviewed.    Zola Button, MD 8/10/20239:53 AM

## 2022-04-22 ENCOUNTER — Telehealth: Payer: Self-pay

## 2022-04-22 LAB — PROSTATE-SPECIFIC AG, SERUM (LABCORP): Prostate Specific Ag, Serum: <0.1 ng/mL (ref 0.0–4.0)

## 2022-04-22 NOTE — Telephone Encounter (Addendum)
Spoke with pt to relay results below.  ----- Message from Raymond Portela, MD sent at 04/22/2022  3:48 PM EDT ----- Please let him know his PSA is down

## 2022-04-28 ENCOUNTER — Other Ambulatory Visit (HOSPITAL_COMMUNITY): Payer: Self-pay

## 2022-05-14 ENCOUNTER — Other Ambulatory Visit (HOSPITAL_COMMUNITY): Payer: Self-pay

## 2022-05-14 ENCOUNTER — Other Ambulatory Visit: Payer: Self-pay | Admitting: Oncology

## 2022-05-14 DIAGNOSIS — C61 Malignant neoplasm of prostate: Secondary | ICD-10-CM

## 2022-05-17 ENCOUNTER — Encounter: Payer: Self-pay | Admitting: Oncology

## 2022-05-17 ENCOUNTER — Other Ambulatory Visit (HOSPITAL_COMMUNITY): Payer: Self-pay

## 2022-05-17 MED ORDER — ABIRATERONE ACETATE 250 MG PO TABS
1000.0000 mg | ORAL_TABLET | Freq: Every day | ORAL | 0 refills | Status: DC
  Filled 2022-05-17 – 2022-05-18 (×2): qty 120, 30d supply, fill #0

## 2022-05-18 ENCOUNTER — Other Ambulatory Visit (HOSPITAL_COMMUNITY): Payer: Self-pay

## 2022-05-23 ENCOUNTER — Other Ambulatory Visit (HOSPITAL_COMMUNITY): Payer: Self-pay

## 2022-06-24 ENCOUNTER — Other Ambulatory Visit (HOSPITAL_COMMUNITY): Payer: Self-pay

## 2022-07-04 IMAGING — PT NM PET TUM IMG SKULL BASE T - THIGH
1 series · 2 of 2 positions shown · non-contrast
Comparison: 02/24/2021

CLINICAL DATA: Prostate cancer with radiation therapy [DATE]. On oral
chemotherapy. PSA of less than 0.1. Evaluate treatment response.

EXAM:
NUCLEAR MEDICINE PET SKULL BASE TO THIGH
TECHNIQUE: 9.3 mCi F18 Piflufolastat (Pylarify) was injected intravenously.
Full-ring PET imaging was performed from the skull base to thigh
after the radiotracer. CT data was obtained and used for attenuation
correction and anatomic localization.

[Series 1195: results mm oncology reading · 3.0mm · 1.06mm/px · 2 of 2 slices shown]
[im 1/2]
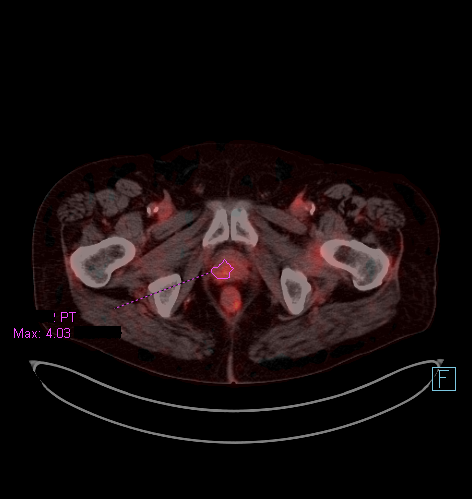
[im 2/2]
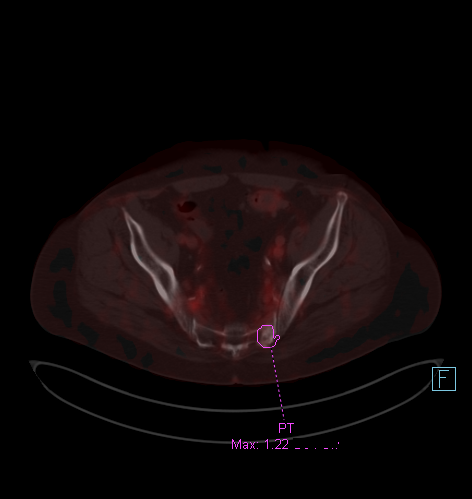

[2 of 2 positions shown; findings below may reference images not displayed]

FINDINGS: NECK

No radiotracer activity in neck lymph nodes.

Incidental CT finding: No cervical adenopathy. Bilateral carotid
atherosclerosis.

CHEST

No radiotracer accumulation within mediastinal or hilar lymph nodes.
No suspicious pulmonary nodules on the CT scan.

Incidental CT finding: Moderate hiatal hernia. Aortic and coronary
artery calcification. Mild cardiomegaly. Scattered calcified
granulomas. Right middle lobe subpleural 5 mm pulmonary nodule is
unchanged on 37/7.

3 mm left lower lobe pulmonary nodule is similar on 36/7.

ABDOMEN/PELVIS

Prostate: Low-level, nonspecific heterogeneous prostate activity is
slightly greater on the right at a S.U.V. max of 4.0.

Lymph nodes: No abnormal radiotracer accumulation within pelvic or
abdominal nodes.

Liver: No evidence of liver metastasis

Incidental CT finding: Normal noncontrast appearance of the spleen,
pancreas, gallbladder, adrenal glands. Upper pole left renal 3.2 cm
fluid density lesion is most consistent with a cyst . No f/up
imaging recommended. 3 mm lower pole right renal collecting system
calculus. Right hepatic lobe 2.8 cm cyst. Scattered colonic
diverticula. Small fat containing left inguinal hernia.

SKELETON

The posterior left sacral lesion measures 2.0 cm and is not
significantly tracer avid on 190/4. Similar in size and morphology
on the prior exam, where it measured a S.U.V. max of 2.1.

No new areas of osseous tracer affinity. Again identified is a right
proximal humerus well-circumscribed sclerotic lesion which is
favored to represent an enchondroma including at on the order of
cm on 55/4.
IMPRESSION: 1. No evidence of tracer avid metastatic disease.
2. Similar appearance of a left sacral sclerotic lesion without
residual tracer affinity. Favor a treated osseous metastasis. No new
osseous metastasis identified.
3. Incidental findings, including: Hiatal hernia. Right
nephrolithiasis. Coronary artery atherosclerosis. Aortic
Atherosclerosis ([id number]).
4. Similar bilateral pulmonary nodules, favoring a benign etiology.

## 2022-07-17 ENCOUNTER — Other Ambulatory Visit: Payer: Self-pay | Admitting: Oncology

## 2022-07-17 DIAGNOSIS — C7951 Secondary malignant neoplasm of bone: Secondary | ICD-10-CM

## 2022-07-17 MED ORDER — ABIRATERONE ACETATE 250 MG PO TABS
1000.0000 mg | ORAL_TABLET | Freq: Every day | ORAL | 0 refills | Status: AC
  Filled 2022-07-17: qty 120, 30d supply, fill #0

## 2022-07-18 ENCOUNTER — Other Ambulatory Visit (HOSPITAL_COMMUNITY): Payer: Self-pay

## 2022-07-25 ENCOUNTER — Other Ambulatory Visit (HOSPITAL_COMMUNITY): Payer: Self-pay

## 2022-07-26 ENCOUNTER — Inpatient Hospital Stay: Payer: Medicare Other | Attending: Oncology

## 2022-07-26 ENCOUNTER — Inpatient Hospital Stay: Payer: Medicare Other

## 2022-07-26 ENCOUNTER — Inpatient Hospital Stay: Payer: Medicare Other | Admitting: Oncology

## 2022-07-26 VITALS — BP 144/82 | HR 81 | Temp 97.8°F | Resp 17 | Ht 69.0 in | Wt 223.2 lb

## 2022-07-26 DIAGNOSIS — Z5111 Encounter for antineoplastic chemotherapy: Secondary | ICD-10-CM | POA: Insufficient documentation

## 2022-07-26 DIAGNOSIS — Z79899 Other long term (current) drug therapy: Secondary | ICD-10-CM | POA: Diagnosis not present

## 2022-07-26 DIAGNOSIS — C7951 Secondary malignant neoplasm of bone: Secondary | ICD-10-CM | POA: Diagnosis present

## 2022-07-26 DIAGNOSIS — C61 Malignant neoplasm of prostate: Secondary | ICD-10-CM

## 2022-07-26 LAB — CBC WITH DIFFERENTIAL (CANCER CENTER ONLY)
Abs Immature Granulocytes: 0.02 10*3/uL (ref 0.00–0.07)
Basophils Absolute: 0.1 10*3/uL (ref 0.0–0.1)
Basophils Relative: 1 %
Eosinophils Absolute: 0.2 10*3/uL (ref 0.0–0.5)
Eosinophils Relative: 2 %
HCT: 36.9 % — ABNORMAL LOW (ref 39.0–52.0)
Hemoglobin: 13.5 g/dL (ref 13.0–17.0)
Immature Granulocytes: 0 %
Lymphocytes Relative: 12 %
Lymphs Abs: 0.9 10*3/uL (ref 0.7–4.0)
MCH: 33.5 pg (ref 26.0–34.0)
MCHC: 36.6 g/dL — ABNORMAL HIGH (ref 30.0–36.0)
MCV: 91.6 fL (ref 80.0–100.0)
Monocytes Absolute: 0.6 10*3/uL (ref 0.1–1.0)
Monocytes Relative: 8 %
Neutro Abs: 5.5 10*3/uL (ref 1.7–7.7)
Neutrophils Relative %: 77 %
Platelet Count: 215 10*3/uL (ref 150–400)
RBC: 4.03 MIL/uL — ABNORMAL LOW (ref 4.22–5.81)
RDW: 12.4 % (ref 11.5–15.5)
WBC Count: 7.2 10*3/uL (ref 4.0–10.5)
nRBC: 0 % (ref 0.0–0.2)

## 2022-07-26 LAB — CMP (CANCER CENTER ONLY)
ALT: 13 U/L (ref 0–44)
AST: 17 U/L (ref 15–41)
Albumin: 4.6 g/dL (ref 3.5–5.0)
Alkaline Phosphatase: 42 U/L (ref 38–126)
Anion gap: 6 (ref 5–15)
BUN: 21 mg/dL (ref 8–23)
CO2: 30 mmol/L (ref 22–32)
Calcium: 9.8 mg/dL (ref 8.9–10.3)
Chloride: 104 mmol/L (ref 98–111)
Creatinine: 1.09 mg/dL (ref 0.61–1.24)
GFR, Estimated: >60 mL/min (ref >60)
Glucose, Bld: 104 mg/dL — ABNORMAL HIGH (ref 70–99)
Potassium: 4 mmol/L (ref 3.5–5.1)
Sodium: 140 mmol/L (ref 135–145)
Total Bilirubin: 1.1 mg/dL (ref 0.3–1.2)
Total Protein: 7.2 g/dL (ref 6.5–8.1)

## 2022-07-26 MED ORDER — LEUPROLIDE ACETATE (3 MONTH) 22.5 MG ~~LOC~~ KIT
22.5000 mg | PACK | Freq: Once | SUBCUTANEOUS | Status: AC
  Administered 2022-07-26: 22.5 mg via SUBCUTANEOUS
  Filled 2022-07-26: qty 22.5

## 2022-07-26 NOTE — Progress Notes (Signed)
Hematology and Oncology Follow Up Visit  Raymond Huang [id number] 12/28/1945 76 y.o. 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  He presented with Gleason score 5+4 = 9, PSA 4.5 at the time of diagnosis.   Prior Therapy:  He is status post radiation therapy under the care of Dr. Tammi Klippel.  He completed a total of 60 Gray to the prostate as well as to the oligometastatic left sacral lesion.  Therapy completed in July 2022.  Current therapy:   Eligard 22.5 mg every 3 months.  This will be repeated today and continued for at least 2 years possibly indefinitely.    Zytiga 1000 mg daily with prednisone 5 mg daily.  This was started in April 2022 however is reduced to 500 mg due to fatigue.  He will complete additional 3 months of therapy and discontinue in February 2023.  Interim History: Raymond Huang presents today for a follow-up visit.  Since the last visit, he reports no major changes in his health.  He continues to be reasonably active playing pickle ball and swimming.  He does report some increased fatigue associated with Zytiga but not interfering with activities of daily living.  He denies any nausea vomiting or abdominal pain.  He denies any recent hospitalizations or illnesses.     Medications: Updated on review. Current Outpatient Medications  Medication Sig Dispense Refill   abiraterone acetate (ZYTIGA) 250 MG tablet Take 4 tablets (1,000 mg total) by mouth daily. Take on an empty stomach 1 hour before or 2 hours after a meal 120 tablet 0   ammonium lactate (LAC-HYDRIN) 12 % lotion Apply topically.     aspirin 81 MG EC tablet Take by mouth.     atenolol (TENORMIN) 50 MG tablet Take 50 mg by mouth 2 (two) times daily.     Coenzyme Q10 200 MG capsule Take by mouth.     ketoconazole (NIZORAL) 2 % shampoo Apply topically.      Leuprolide Acetate, 3 Month, (ELIGARD) 22.5 MG injection Inject into the skin every 3 (three) months.     lisinopril-hydrochlorothiazide (ZESTORETIC) 20-12.5 MG tablet Take 1 tablet by mouth daily.     mometasone (ELOCON) 0.1 % lotion Apply 0.1 application topically 1 day or 1 dose.     Multiple Vitamin (MULTIVITAMINS PO) Take by mouth.     pravastatin (PRAVACHOL) 40 MG tablet Take 40 mg by mouth daily.     predniSONE (DELTASONE) 5 MG tablet Take 1 tablet (5 mg total) by mouth daily with breakfast. 90 tablet 3   rivaroxaban (XARELTO) 20 MG TABS tablet TAKE 1 TABLET DAILY WITH DINNER     No current facility-administered medications for this visit.     Allergies: No Known Allergies    Physical Exam:   Blood pressure (!) 144/82, pulse 81, temperature 97.8 F (36.6 C), temperature source Temporal, resp. rate 17, height '5\' 9"'$  (1.753 m), weight 223 lb 3.2 oz (101.2 kg), SpO2 97 %.   ECOG: 1    General appearance: Alert, awake without any distress. Head: Atraumatic without abnormalities Oropharynx: Without any thrush or ulcers. Eyes: No scleral icterus. Lymph nodes: No lymphadenopathy noted in the cervical, supraclavicular, or axillary nodes Heart:regular rate and rhythm, without any murmurs or gallops.   Lung: Clear to auscultation without any rhonchi, wheezes or dullness to percussion. Abdomin: Soft, nontender without any shifting dullness  or ascites. Musculoskeletal: No clubbing or cyanosis. Neurological: No motor or sensory deficits. Skin: No rashes or lesions.         Lab Results: Lab Results  Component Value Date   WBC 6.7 04/21/2022   HGB 12.7 (L) 04/21/2022   HCT 36.2 (L) 04/21/2022   MCV 94.5 04/21/2022   PLT 176 04/21/2022     Chemistry      Component Value Date/Time   NA 138 04/21/2022 0941   K 4.1 04/21/2022 0941   CL 101 04/21/2022 0941   CO2 30 04/21/2022 0941   BUN 21 04/21/2022 0941   CREATININE 1.25 (H) 04/21/2022 0941      Component Value  Date/Time   CALCIUM 9.3 04/21/2022 0941   ALKPHOS 44 04/21/2022 0941   AST 22 04/21/2022 0941   ALT 16 04/21/2022 0941   BILITOT 1.1 04/21/2022 0941       Latest Reference Range & Units 01/20/22 09:34 04/21/22 09:41  Prostate Specific Ag, Serum 0.0 - 4.0 ng/mL <0.1 <0.1     Impression and Plan:  76 year old man with:   1.  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   The natural course of his disease and treatment options were discussed at this time.  He has completed local therapy as well as ongoing systemic therapy with androgen deprivation and Zytiga with excellent response.  The duration of therapy was debated and discussed at this time.  Continuing treatment indefinitely given his metastatic disease versus therapy de-escalation and discontinuing systemic therapy were discussed.  At this time, I have recommended continued combination therapy until at least February 2024.  At that time my reevaluation with a possible PSMA PET scan and repeat PSA could be considered.  Therapy de-escalation could be reintroduced at this time.  He understands this concept is not vigorously tested.  He is agreeable with this plan.  He is planning to establish care with oncology at [facility] after he is planning to relocate in the near future.   2.  Androgen depravation: He will receive Eligard today and repeated in 3 months.  Duration of therapy would be at least 2 years possibly indefinitely.   3.  Bone directed therapy: I recommended calcium and vitamin D supplements.   4.  Hypertension: Mildly elevated but overall under control.     5.  Goals of care and prognosis: His disease is in curable though aggressive measures are warranted at this time.   6.  Follow-up: No follow-up will be scheduled he is relocating to [place].   30  minutes were spent on this and counter.  Time was dedicated  to updating his disease status, treatment choices and outlining future plan of care review.   Zola Button, MD 11/14/202310:06 AM

## 2022-07-27 ENCOUNTER — Encounter: Payer: Self-pay | Admitting: Oncology

## 2022-07-28 ENCOUNTER — Other Ambulatory Visit (HOSPITAL_COMMUNITY): Payer: Self-pay

## 2022-07-28 ENCOUNTER — Telehealth: Payer: Self-pay

## 2022-07-28 LAB — PROSTATE-SPECIFIC AG, SERUM (LABCORP): Prostate Specific Ag, Serum: <0.1 ng/mL (ref 0.0–4.0)

## 2022-07-28 NOTE — Telephone Encounter (Signed)
Pt advised with VU 

## 2022-07-28 NOTE — Telephone Encounter (Signed)
-----   Message from Arna Snipe, RN sent at 07/28/2022 10:10 AM EST -----  ----- Message ----- From: Wyatt Portela, MD Sent: 07/28/2022   8:45 AM EST To: Arna Snipe, RN  Please let him know his PSA is still low

## 2022-08-09 ENCOUNTER — Other Ambulatory Visit (HOSPITAL_COMMUNITY): Payer: Self-pay

## 2022-09-14 ENCOUNTER — Other Ambulatory Visit (HOSPITAL_COMMUNITY): Payer: Self-pay

## 2023-01-18 ENCOUNTER — Other Ambulatory Visit: Payer: Self-pay | Admitting: Hematology and Oncology
